# Patient Record
Sex: Female | Born: 1960 | Race: Black or African American | Hispanic: No | Marital: Single | Smoking: Former smoker
Health system: Southern US, Community
[De-identification: ages and names within clinical notes are randomized; demographics above are authoritative.]

## PROBLEM LIST (undated history)

## (undated) DIAGNOSIS — Z6841 Body Mass Index (BMI) 40.0 and over, adult: Secondary | ICD-10-CM

## (undated) DIAGNOSIS — E119 Type 2 diabetes mellitus without complications: Secondary | ICD-10-CM

## (undated) DIAGNOSIS — I10 Essential (primary) hypertension: Secondary | ICD-10-CM

## (undated) DIAGNOSIS — J441 Chronic obstructive pulmonary disease with (acute) exacerbation: Secondary | ICD-10-CM

## (undated) DIAGNOSIS — E7800 Pure hypercholesterolemia, unspecified: Secondary | ICD-10-CM

## (undated) DIAGNOSIS — J45909 Unspecified asthma, uncomplicated: Secondary | ICD-10-CM

## (undated) DIAGNOSIS — I5033 Acute on chronic diastolic (congestive) heart failure: Secondary | ICD-10-CM

## (undated) HISTORY — DX: Acute on chronic diastolic (congestive) heart failure: I50.33

## (undated) HISTORY — PX: OTHER SURGICAL HISTORY: SHX169

## (undated) HISTORY — DX: Body Mass Index (BMI) 40.0 and over, adult: Z684

## (undated) HISTORY — DX: Chronic obstructive pulmonary disease with (acute) exacerbation: J44.1

---

## 2014-06-03 ENCOUNTER — Emergency Department (HOSPITAL_COMMUNITY)
Admission: EM | Admit: 2014-06-03 | Discharge: 2014-06-03 | Disposition: A | Discharge status: Home / Self Care | Payer: Medicaid Other | Attending: Emergency Medicine | Admitting: Emergency Medicine

## 2014-06-03 ENCOUNTER — Emergency Department (HOSPITAL_COMMUNITY): Payer: Medicaid Other

## 2014-06-03 DIAGNOSIS — Z3202 Encounter for pregnancy test, result negative: Secondary | ICD-10-CM | POA: Diagnosis not present

## 2014-06-03 DIAGNOSIS — T192XXA Foreign body in vulva and vagina, initial encounter: Secondary | ICD-10-CM | POA: Insufficient documentation

## 2014-06-03 DIAGNOSIS — Y92410 Unspecified street and highway as the place of occurrence of the external cause: Secondary | ICD-10-CM | POA: Insufficient documentation

## 2014-06-03 DIAGNOSIS — IMO0002 Reserved for concepts with insufficient information to code with codable children: Secondary | ICD-10-CM | POA: Insufficient documentation

## 2014-06-03 DIAGNOSIS — Y9389 Activity, other specified: Secondary | ICD-10-CM | POA: Insufficient documentation

## 2014-06-03 DIAGNOSIS — S335XXA Sprain of ligaments of lumbar spine, initial encounter: Secondary | ICD-10-CM | POA: Diagnosis not present

## 2014-06-03 DIAGNOSIS — Z79899 Other long term (current) drug therapy: Secondary | ICD-10-CM | POA: Diagnosis not present

## 2014-06-03 DIAGNOSIS — S39012A Strain of muscle, fascia and tendon of lower back, initial encounter: Secondary | ICD-10-CM

## 2014-06-03 DIAGNOSIS — E119 Type 2 diabetes mellitus without complications: Secondary | ICD-10-CM | POA: Insufficient documentation

## 2014-06-03 LAB — URINALYSIS, ROUTINE W REFLEX MICROSCOPIC
BILIRUBIN URINE: NEGATIVE
Glucose, UA: NEGATIVE mg/dL
Ketones, ur: NEGATIVE mg/dL
Leukocytes, UA: NEGATIVE
Nitrite: NEGATIVE
PH: 5.5 (ref 5.0–8.0)
Protein, ur: NEGATIVE mg/dL
SPECIFIC GRAVITY, URINE: 1.018 (ref 1.005–1.030)
Urobilinogen, UA: 1 mg/dL (ref 0.0–1.0)

## 2014-06-03 LAB — URINE MICROSCOPIC-ADD ON

## 2014-06-03 LAB — POC URINE PREG, ED: Preg Test, Ur: NEGATIVE

## 2014-06-03 LAB — WET PREP, GENITAL
Clue Cells Wet Prep HPF POC: NONE SEEN
TRICH WET PREP: NONE SEEN
WBC, Wet Prep HPF POC: NONE SEEN
YEAST WET PREP: NONE SEEN

## 2014-06-03 MED ORDER — DIAZEPAM 5 MG PO TABS: 5.0000 mg | ORAL_TABLET | Freq: Four times a day (QID) | ORAL | Status: DC | PRN

## 2014-06-03 MED ORDER — OXYCODONE-ACETAMINOPHEN 5-325 MG PO TABS
1.0000 | ORAL_TABLET | Freq: Once | ORAL | Status: AC
  Administered 2014-06-03: 1 via ORAL
  Filled 2014-06-03: qty 1
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
## 2014-06-03 NOTE — Discharge Instructions (Signed)
Vaginal Foreign Body A vaginal foreign body is any object that gets stuck or left inside the vagina. Vaginal foreign bodies left in the vagina for a long time can cause irritation and infection. In most cases, symptoms go away once the vaginal foreign body is found and removed. Rarely, a foreign object can break through the walls of the vagina and cause a serious infection inside the abdomen.  CAUSES  The most common vaginal foreign bodies are:  Tampons.  Contraceptive devices.  Toilet tissue left in the vagina.  Small objects that were placed in the vagina out of curiosity and got stuck.  A result of sexual abuse. SIGNS AND SYMPTOMS  Light vaginal bleeding.  Blood-tinged vaginal fluid (discharge).  Vaginal discharge that smells bad.  Vaginal itching or burning.  Redness, swelling, or rash near the opening of the vagina.  Abdominal pain.  Fever.  Burning or frequent urination. DIAGNOSIS  Your health care provider may be able to diagnose a vaginal foreign body based on the information you provide, your symptoms, and a physical exam. Your health care provider may also perform the following tests to check for infection:  A swab of the discharge to check under a microscope for bacteria (culture).  A urine culture.  An examination of the vagina with a small, lighted scope (vaginoscopy).  Imaging tests to get a picture of the inside of your vagina, such as:  Ultrasound.  X-ray.  MRI. TREATMENT  In most cases, a vaginal foreign body can be easily removed and the symptoms usually go away very quickly. Other treatment may include:   If the vaginal foreign body is not easily removed, medicine may be given to make you go to sleep (general anesthesia) to have the object removed.  Emergency surgery may be necessary if an infection spreads through the walls of the vagina into the abdomen (acute abdomen). This is rare.  You may need to take antibiotic medicine if you have a  vaginal or urinary tract infection. HOME CARE INSTRUCTIONS   Take medicines only as directed by your health care provider.  If you were prescribed an antibiotic medicine, finish it all even if you start to feel better.  Do not have sex or use tampons until your health care provider says it is okay.  Do not douche or use vaginal rinses unless your health care provider recommends it.  Keep all follow-up visits as directed by your health care provider. This is important. SEEK MEDICAL CARE IF:  You have abdominal pain or burning pain when urinating.  You have a fever. SEEK IMMEDIATE MEDICAL CARE IF:  You have heavy vaginal bleeding or discharge.   You have very bad abdominal pain.  MAKE SURE YOU:  Understand these instructions.  Will watch your condition.  Will get help right away if you are not doing well or get worse. Document Released: 01/09/2014 Document Reviewed: 06/24/2013 [facility] Patient Information 2015 [place], [place]. This information is not intended to replace advice given to you by your health care provider. Make sure you discuss any questions you have with your health care provider. Back Pain, Adult Low back pain is very common. About 1 in 5 people have back pain.The cause of low back pain is rarely dangerous. The pain often gets better over time.About half of people with a sudden onset of back pain feel better in just 2 weeks. About 8 in 10 people feel better by 6 weeks.  CAUSES Some common causes of back pain include:  Strain of the muscles or ligaments supporting the spine.  Wear and tear (degeneration) of the spinal discs.  Arthritis.  Direct injury to the back. DIAGNOSIS Most of the time, the direct cause of low back pain is not known.However, back pain can be treated effectively even when the exact cause of the pain is unknown.Answering your caregiver's questions about your overall health and symptoms is one of the most accurate ways to make sure the  cause of your pain is not dangerous. If your caregiver needs more information, he or she may order lab work or imaging tests (X-rays or MRIs).However, even if imaging tests show changes in your back, this usually does not require surgery. HOME CARE INSTRUCTIONS For many people, back pain returns.Since low back pain is rarely dangerous, it is often a condition that people can learn to [facility] their own.   Remain active. It is stressful on the back to sit or stand in one place. Do not sit, drive, or stand in one place for more than 30 minutes at a time. Take short walks on level surfaces as soon as pain allows.Try to increase the length of time you walk each day.  Do not stay in bed.Resting more than 1 or 2 days can delay your recovery.  Do not avoid exercise or work.Your body is made to move.It is not dangerous to be active, even though your back may hurt.Your back will likely heal faster if you return to being active before your pain is gone.  Pay attention to your body when you bend and lift. Many people have less discomfortwhen lifting if they bend their knees, keep the load close to their bodies,and avoid twisting. Often, the most comfortable positions are those that put less stress on your recovering back.  Find a comfortable position to sleep. Use a firm mattress and lie on your side with your knees slightly bent. If you lie on your back, put a pillow under your knees.  Only take over-the-counter or prescription medicines as directed by your caregiver. Over-the-counter medicines to reduce pain and inflammation are often the most helpful.Your caregiver may prescribe muscle relaxant drugs.These medicines help dull your pain so you can more quickly return to your normal activities and healthy exercise.  Put ice on the injured area.  Put ice in a plastic bag.  Place a towel between your skin and the bag.  Leave the ice on for 15-20 minutes, 03-04 times a day for the first 2 to 3  days. After that, ice and heat may be alternated to reduce pain and spasms.  Ask your caregiver about trying back exercises and gentle massage. This may be of some benefit.  Avoid feeling anxious or stressed.Stress increases muscle tension and can worsen back pain.It is important to recognize when you are anxious or stressed and learn ways to manage it.Exercise is a great option. SEEK MEDICAL CARE IF:  You have pain that is not relieved with rest or medicine.  You have pain that does not improve in 1 week.  You have new symptoms.  You are generally not feeling well. SEEK IMMEDIATE MEDICAL CARE IF:   You have pain that radiates from your back into your legs.  You develop new bowel or bladder control problems.  You have unusual weakness or numbness in your arms or legs.  You develop nausea or vomiting.  You develop abdominal pain.  You feel faint. Document Released: 08/25/2005 Document Revised: 02/24/2012 Document Reviewed: 12/27/2013 [employer] Patient Information 2015 [place]. This information is not intended to replace advice given to you by your health care provider. Make sure you discuss any questions you have with your health care provider.
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
## 2014-06-05 ENCOUNTER — Telehealth (HOSPITAL_BASED_OUTPATIENT_CLINIC_OR_DEPARTMENT_OTHER): Payer: Self-pay | Admitting: Emergency Medicine

## 2014-06-05 LAB — GC/CHLAMYDIA PROBE AMP
CT Probe RNA: NEGATIVE
GC Probe RNA: NEGATIVE

## 2015-06-25 ENCOUNTER — Emergency Department (HOSPITAL_COMMUNITY)
Admission: EM | Admit: 2015-06-25 | Discharge: 2015-06-25 | Disposition: A | Discharge status: Home / Self Care | Payer: Medicaid Other | Attending: Emergency Medicine | Admitting: Emergency Medicine

## 2015-06-25 ENCOUNTER — Encounter (HOSPITAL_COMMUNITY): Payer: Self-pay | Admitting: *Deleted

## 2015-06-25 ENCOUNTER — Emergency Department (HOSPITAL_COMMUNITY): Payer: Medicaid Other

## 2015-06-25 DIAGNOSIS — Z720 Tobacco use: Secondary | ICD-10-CM | POA: Insufficient documentation

## 2015-06-25 DIAGNOSIS — I10 Essential (primary) hypertension: Secondary | ICD-10-CM | POA: Insufficient documentation

## 2015-06-25 DIAGNOSIS — E119 Type 2 diabetes mellitus without complications: Secondary | ICD-10-CM | POA: Diagnosis not present

## 2015-06-25 DIAGNOSIS — J189 Pneumonia, unspecified organism: Secondary | ICD-10-CM

## 2015-06-25 DIAGNOSIS — J159 Unspecified bacterial pneumonia: Secondary | ICD-10-CM | POA: Diagnosis not present

## 2015-06-25 DIAGNOSIS — Z79899 Other long term (current) drug therapy: Secondary | ICD-10-CM | POA: Diagnosis not present

## 2015-06-25 DIAGNOSIS — J45909 Unspecified asthma, uncomplicated: Secondary | ICD-10-CM | POA: Insufficient documentation

## 2015-06-25 DIAGNOSIS — R05 Cough: Secondary | ICD-10-CM | POA: Diagnosis present

## 2015-06-25 HISTORY — DX: Type 2 diabetes mellitus without complications: E11.9

## 2015-06-25 LAB — CBG MONITORING, ED: Glucose-Capillary: 106 mg/dL — ABNORMAL HIGH (ref 65–99)

## 2015-06-25 MED ORDER — LEVOFLOXACIN 500 MG PO TABS
500.0000 mg | ORAL_TABLET | Freq: Once | ORAL | Status: AC
  Administered 2015-06-25: 500 mg via ORAL
  Filled 2015-06-25: qty 1

## 2015-06-25 MED ORDER — LEVOFLOXACIN 500 MG PO TABS: 500.0000 mg | ORAL_TABLET | Freq: Every day | ORAL | Status: DC

## 2015-06-25 MED ORDER — GUAIFENESIN 100 MG/5ML PO LIQD: 100.0000 mg | ORAL | Status: DC | PRN

## 2015-06-25 NOTE — Discharge Instructions (Signed)

## 2015-06-25 NOTE — ED Notes (Addendum)
Pt complains of cough, congestion fever for the past 5 weeks, which became worse in the past 3 days. Pt has hx of asthma. Pt states her breathing is worse at night.

## 2015-06-25 NOTE — ED Provider Notes (Signed)
CSN: [id number]     Arrival date & time 06/25/15  1304 History  By signing my name below, I, Gwenyth Oberatherine Macek, attest that this documentation has been prepared under the direction and in the presence of Marlon Peliffany Takako Minckler, PA-C.  Electronically Signed: Gwenyth Oberatherine Macek, ED Scribe. 06/25/2015. 2:15 PM.   Chief Complaint  Patient presents with  . Cough  . Nasal Congestion   The history is provided by the patient. No language interpreter was used.    HPI Comments: Sandra Solis is a 54 y.o. female with a history of DM, asthma, high cholesterol and HTN who presents to the Emergency Department complaining of intermittent, moderate productive cough with yellow sputum that started 6 weeks ago. She states sneezing, rhinorrhea and yellow nasal discharge as associated symptoms. Her symptoms becomes worse at night. Pt has tried her inhaler and tea with lemon and honey with no relief. Pt reports that onset of symptoms started after she spent time with her grandchildren who had similar symptoms. Pt has not been monitoring her blood sugar, but is compliant with her medications. She denies fever.   ROS: The patient denies diaphoresis, fever, headache, weakness (general or focal), confusion, change of vision,  dysphagia, aphagia, shortness of breath,  abdominal pains, nausea, vomiting, diarrhea, lower extremity swelling, rash, neck pain, chest pain   PCP: No PCP Per Patient  Blood pressure 118/61, pulse 93, temperature 98 F (36.7 C), temperature source Oral, resp. rate 18, SpO2 94 %.  Past Medical History  Diagnosis Date  . Diabetes mellitus without complication (HCC)    History reviewed. No pertinent past surgical history. No family history on file. Social History  Substance Use Topics  . Smoking status: Current Every Day Smoker  . Smokeless tobacco: None  . Alcohol Use: No   OB History    No data available     Review of Systems  Constitutional: Negative for fever.  HENT: Positive for rhinorrhea  and sneezing.   Respiratory: Positive for cough.    Allergies  Review of patient's allergies indicates no known allergies.  Home Medications   Prior to Admission medications   Medication Sig Start Date End Date Taking? Authorizing Provider  albuterol (PROVENTIL HFA;VENTOLIN HFA) 108 (90 BASE) MCG/ACT inhaler Inhale 2 puffs into the lungs every 6 (six) hours as needed for wheezing or shortness of breath.    Historical Provider, MD  diazepam (VALIUM) 5 MG tablet Take 1 tablet (5 mg total) by mouth every 6 (six) hours as needed for muscle spasms. 06/03/14   Mirian MoMatthew Gentry, MD  guaiFENesin (ROBITUSSIN) 100 MG/5ML liquid Take 5-10 mLs (100-200 mg total) by mouth every 4 (four) hours as needed for cough. 06/25/15   Mozes Sagar Neva SeatGreene, PA-C  levofloxacin (LEVAQUIN) 500 MG tablet Take 1 tablet (500 mg total) by mouth daily. 06/26/15   Tacia Hindley Neva SeatGreene, PA-C  lisinopril (PRINIVIL,ZESTRIL) 10 MG tablet Take 10 mg by mouth every morning.    Historical Provider, MD  metFORMIN (GLUCOPHAGE) 500 MG tablet Take 500 mg by mouth 2 (two) times daily with a meal.    Historical Provider, MD  QUEtiapine (SEROQUEL) 50 MG tablet Take 50 mg by mouth at bedtime as needed (for sleep).    Historical Provider, MD  sertraline (ZOLOFT) 100 MG tablet Take 50 mg by mouth at bedtime.    Historical Provider, MD   BP 118/61 mmHg  Pulse 93  Temp(Src) 98 F (36.7 C) (Oral)  Resp 18  SpO2 94% Physical Exam  Constitutional: She appears well-developed and  well-nourished. No distress.  HENT:  Head: Normocephalic and atraumatic.  Eyes: Conjunctivae and EOM are normal.  Neck: Neck supple. No tracheal deviation present.  Cardiovascular: Normal rate.   Pulmonary/Chest: Effort normal. No accessory muscle usage. No respiratory distress. She has no decreased breath sounds. She has no wheezes. She has rhonchi. She has no rales.  Musculoskeletal:  No lower extremity swelling  Skin: Skin is warm and dry.  Psychiatric: She has a normal  mood and affect. Her behavior is normal.  Nursing note and vitals reviewed.   ED Course  Procedures  DIAGNOSTIC STUDIES: Oxygen Saturation is 94% on RA, adequate by my interpretation.    COORDINATION OF CARE: 2:11 PM Discussed x-ray results and treatment plan with pt which includes Levaquin and Guaifenesin . She agreed to plan.  Labs Review Labs Reviewed  CBG MONITORING, ED    Imaging Review Dg Chest 2 View  06/25/2015  CLINICAL DATA:  Cough and congestion for 5 weeks worsening of symptoms over past 3 days, diabetes mellitus, asthma, smoker EXAM: CHEST  2 VIEW COMPARISON:  None FINDINGS: Borderline enlargement of cardiac silhouette. Mediastinal contours and pulmonary vascularity normal. Atherosclerotic calcification aorta. Minimal central peribronchial thickening. Subsegmental atelectasis LEFT base. No acute infiltrate, pleural effusion or pneumothorax. Scattered endplate spur formation thoracic spine. IMPRESSION: Minimal bronchitic changes with subsegmental atelectasis LEFT base. Electronically Signed   By: Ulyses Southward M.D.   On: 06/25/2015 13:59   I have personally reviewed and evaluated these images as part of my medical decision-making.   EKG Interpretation None      MDM   Final diagnoses:  CAP (community acquired pneumonia)    Patient in no distress. Xray concerning for pneumonia. Due to co morbidities will treat with Levaquin. CBG check in ED and less than 250.  F/u with PCP strict return to ED precautions given. Normal O2 sats on room air, no exertional angina or exertional  SOB.  Medications  levofloxacin (LEVAQUIN) tablet 500 mg (not administered)    54 y.o.Sandra Solis's medical screening exam was performed and I feel the patient has had an appropriate workup for their chief complaint at this time and likelihood of emergent condition existing is low. They have been counseled on decision, discharge, follow up and which symptoms necessitate immediate return to the  emergency department. They or their family verbally stated understanding and agreement with plan and discharged in stable condition.   Vital signs are stable at discharge. Filed Vitals:   06/25/15 1318  BP:   Pulse:   Temp: 98 F (36.7 C)  Resp:    I personally performed the services described in this documentation, which was scribed in my presence. The recorded information has been reviewed and is accurate.    Marlon Pel, PA-C 06/25/15 1425  Laurence Spates, MD 06/26/15 [phone]

## 2015-11-19 ENCOUNTER — Emergency Department (HOSPITAL_COMMUNITY)
Admission: EM | Admit: 2015-11-19 | Discharge: 2015-11-19 | Disposition: A | Discharge status: Home / Self Care | Payer: Medicaid Other | Attending: Emergency Medicine | Admitting: Emergency Medicine

## 2015-11-19 ENCOUNTER — Emergency Department (HOSPITAL_COMMUNITY): Payer: Medicaid Other

## 2015-11-19 ENCOUNTER — Encounter (HOSPITAL_COMMUNITY): Payer: Self-pay | Admitting: Family Medicine

## 2015-11-19 DIAGNOSIS — F17200 Nicotine dependence, unspecified, uncomplicated: Secondary | ICD-10-CM | POA: Diagnosis not present

## 2015-11-19 DIAGNOSIS — E119 Type 2 diabetes mellitus without complications: Secondary | ICD-10-CM | POA: Diagnosis not present

## 2015-11-19 DIAGNOSIS — R05 Cough: Secondary | ICD-10-CM | POA: Insufficient documentation

## 2015-11-19 DIAGNOSIS — R079 Chest pain, unspecified: Secondary | ICD-10-CM | POA: Insufficient documentation

## 2015-11-19 DIAGNOSIS — J45909 Unspecified asthma, uncomplicated: Secondary | ICD-10-CM | POA: Insufficient documentation

## 2015-11-19 DIAGNOSIS — I10 Essential (primary) hypertension: Secondary | ICD-10-CM | POA: Diagnosis not present

## 2015-11-19 HISTORY — DX: Pure hypercholesterolemia, unspecified: E78.00

## 2015-11-19 HISTORY — DX: Essential (primary) hypertension: I10

## 2015-11-19 HISTORY — DX: Unspecified asthma, uncomplicated: J45.909

## 2015-11-19 LAB — I-STAT TROPONIN, ED: TROPONIN I, POC: 0 ng/mL (ref 0.00–0.08)

## 2015-11-19 LAB — CBC
HCT: 46.1 % — ABNORMAL HIGH (ref 36.0–46.0)
Hemoglobin: 14.2 g/dL (ref 12.0–15.0)
MCH: 26.5 pg (ref 26.0–34.0)
MCHC: 30.8 g/dL (ref 30.0–36.0)
MCV: 86.2 fL (ref 78.0–100.0)
Platelets: 289 10*3/uL (ref 150–400)
RBC: 5.35 MIL/uL — AB (ref 3.87–5.11)
RDW: 15.3 % (ref 11.5–15.5)
WBC: 9.6 10*3/uL (ref 4.0–10.5)

## 2015-11-19 LAB — BASIC METABOLIC PANEL
ANION GAP: 12 (ref 5–15)
BUN: 7 mg/dL (ref 6–20)
CALCIUM: 9.7 mg/dL (ref 8.9–10.3)
CO2: 30 mmol/L (ref 22–32)
Chloride: 100 mmol/L — ABNORMAL LOW (ref 101–111)
Creatinine, Ser: 0.76 mg/dL (ref 0.44–1.00)
GFR calc non Af Amer: >60 mL/min (ref >60)
Glucose, Bld: 192 mg/dL — ABNORMAL HIGH (ref 65–99)
Potassium: 4.1 mmol/L (ref 3.5–5.1)
SODIUM: 142 mmol/L (ref 135–145)

## 2015-11-19 NOTE — ED Notes (Signed)
Unable to locate when called for room x3 
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
## 2016-01-18 ENCOUNTER — Ambulatory Visit: Payer: Medicaid Other | Admitting: Gastroenterology

## 2016-05-22 ENCOUNTER — Ambulatory Visit (HOSPITAL_COMMUNITY)
Admission: RE | Admit: 2016-05-22 | Discharge: 2016-05-22 | Disposition: A | Discharge status: Home / Self Care | Payer: Medicaid Other | Source: Ambulatory Visit | Attending: Primary Care | Admitting: Primary Care

## 2016-05-22 ENCOUNTER — Other Ambulatory Visit (HOSPITAL_COMMUNITY): Payer: Self-pay | Admitting: Primary Care

## 2016-05-22 DIAGNOSIS — J449 Chronic obstructive pulmonary disease, unspecified: Secondary | ICD-10-CM | POA: Diagnosis present

## 2016-05-22 DIAGNOSIS — R52 Pain, unspecified: Secondary | ICD-10-CM

## 2016-05-22 DIAGNOSIS — R079 Chest pain, unspecified: Secondary | ICD-10-CM | POA: Diagnosis not present

## 2017-12-30 ENCOUNTER — Encounter (HOSPITAL_COMMUNITY): Payer: Self-pay | Admitting: Emergency Medicine

## 2017-12-30 ENCOUNTER — Other Ambulatory Visit: Payer: Self-pay

## 2017-12-30 ENCOUNTER — Emergency Department (HOSPITAL_COMMUNITY): Payer: Medicaid Other

## 2017-12-30 ENCOUNTER — Inpatient Hospital Stay (HOSPITAL_COMMUNITY)
Admission: EM | Admit: 2017-12-30 | Discharge: 2018-01-02 | DRG: 291 | Disposition: A | Discharge status: Home / Self Care | Payer: Medicaid Other | Attending: Internal Medicine | Admitting: Internal Medicine

## 2017-12-30 DIAGNOSIS — Z833 Family history of diabetes mellitus: Secondary | ICD-10-CM | POA: Diagnosis not present

## 2017-12-30 DIAGNOSIS — F329 Major depressive disorder, single episode, unspecified: Secondary | ICD-10-CM | POA: Diagnosis present

## 2017-12-30 DIAGNOSIS — Z8249 Family history of ischemic heart disease and other diseases of the circulatory system: Secondary | ICD-10-CM

## 2017-12-30 DIAGNOSIS — Y92230 Patient room in hospital as the place of occurrence of the external cause: Secondary | ICD-10-CM | POA: Diagnosis not present

## 2017-12-30 DIAGNOSIS — Z794 Long term (current) use of insulin: Secondary | ICD-10-CM | POA: Diagnosis not present

## 2017-12-30 DIAGNOSIS — Z7984 Long term (current) use of oral hypoglycemic drugs: Secondary | ICD-10-CM | POA: Diagnosis not present

## 2017-12-30 DIAGNOSIS — J45909 Unspecified asthma, uncomplicated: Secondary | ICD-10-CM

## 2017-12-30 DIAGNOSIS — I10 Essential (primary) hypertension: Secondary | ICD-10-CM | POA: Diagnosis not present

## 2017-12-30 DIAGNOSIS — Z9981 Dependence on supplemental oxygen: Secondary | ICD-10-CM | POA: Diagnosis not present

## 2017-12-30 DIAGNOSIS — E1165 Type 2 diabetes mellitus with hyperglycemia: Secondary | ICD-10-CM | POA: Diagnosis not present

## 2017-12-30 DIAGNOSIS — Z79899 Other long term (current) drug therapy: Secondary | ICD-10-CM | POA: Diagnosis not present

## 2017-12-30 DIAGNOSIS — I5021 Acute systolic (congestive) heart failure: Secondary | ICD-10-CM | POA: Diagnosis present

## 2017-12-30 DIAGNOSIS — Z66 Do not resuscitate: Secondary | ICD-10-CM | POA: Diagnosis present

## 2017-12-30 DIAGNOSIS — F17210 Nicotine dependence, cigarettes, uncomplicated: Secondary | ICD-10-CM | POA: Diagnosis present

## 2017-12-30 DIAGNOSIS — I5032 Chronic diastolic (congestive) heart failure: Secondary | ICD-10-CM | POA: Diagnosis present

## 2017-12-30 DIAGNOSIS — M7989 Other specified soft tissue disorders: Secondary | ICD-10-CM | POA: Diagnosis not present

## 2017-12-30 DIAGNOSIS — R0602 Shortness of breath: Secondary | ICD-10-CM

## 2017-12-30 DIAGNOSIS — E785 Hyperlipidemia, unspecified: Secondary | ICD-10-CM | POA: Diagnosis present

## 2017-12-30 DIAGNOSIS — Z87891 Personal history of nicotine dependence: Secondary | ICD-10-CM

## 2017-12-30 DIAGNOSIS — E119 Type 2 diabetes mellitus without complications: Secondary | ICD-10-CM | POA: Diagnosis not present

## 2017-12-30 DIAGNOSIS — I110 Hypertensive heart disease with heart failure: Principal | ICD-10-CM | POA: Diagnosis present

## 2017-12-30 DIAGNOSIS — T380X5A Adverse effect of glucocorticoids and synthetic analogues, initial encounter: Secondary | ICD-10-CM | POA: Diagnosis not present

## 2017-12-30 DIAGNOSIS — R0600 Dyspnea, unspecified: Secondary | ICD-10-CM

## 2017-12-30 DIAGNOSIS — G4700 Insomnia, unspecified: Secondary | ICD-10-CM | POA: Diagnosis present

## 2017-12-30 DIAGNOSIS — F339 Major depressive disorder, recurrent, unspecified: Secondary | ICD-10-CM | POA: Diagnosis not present

## 2017-12-30 DIAGNOSIS — I5033 Acute on chronic diastolic (congestive) heart failure: Secondary | ICD-10-CM

## 2017-12-30 DIAGNOSIS — R0902 Hypoxemia: Secondary | ICD-10-CM | POA: Diagnosis present

## 2017-12-30 DIAGNOSIS — F17200 Nicotine dependence, unspecified, uncomplicated: Secondary | ICD-10-CM | POA: Diagnosis not present

## 2017-12-30 DIAGNOSIS — J441 Chronic obstructive pulmonary disease with (acute) exacerbation: Secondary | ICD-10-CM | POA: Diagnosis present

## 2017-12-30 HISTORY — DX: Acute on chronic diastolic (congestive) heart failure: I50.33

## 2017-12-30 LAB — CBC WITH DIFFERENTIAL/PLATELET
BASOS ABS: 0 10*3/uL (ref 0.0–0.1)
Basophils Relative: 1 %
EOS ABS: 0 10*3/uL (ref 0.0–0.7)
Eosinophils Relative: 0 %
HCT: 46.1 % — ABNORMAL HIGH (ref 36.0–46.0)
HEMOGLOBIN: 14.5 g/dL (ref 12.0–15.0)
LYMPHS ABS: 1.6 10*3/uL (ref 0.7–4.0)
LYMPHS PCT: 25 %
MCH: 28 pg (ref 26.0–34.0)
MCHC: 31.5 g/dL (ref 30.0–36.0)
MCV: 89.2 fL (ref 78.0–100.0)
Monocytes Absolute: 0.7 10*3/uL (ref 0.1–1.0)
Monocytes Relative: 11 %
NEUTROS PCT: 63 %
Neutro Abs: 4 10*3/uL (ref 1.7–7.7)
Platelets: 230 10*3/uL (ref 150–400)
RBC: 5.17 MIL/uL — AB (ref 3.87–5.11)
RDW: 14.5 % (ref 11.5–15.5)
WBC: 6.2 10*3/uL (ref 4.0–10.5)

## 2017-12-30 LAB — BASIC METABOLIC PANEL
ANION GAP: 12 (ref 5–15)
BUN: 6 mg/dL (ref 6–20)
CHLORIDE: 96 mmol/L — AB (ref 101–111)
CO2: 27 mmol/L (ref 22–32)
Calcium: 9.2 mg/dL (ref 8.9–10.3)
Creatinine, Ser: 0.72 mg/dL (ref 0.44–1.00)
GFR calc Af Amer: >60 mL/min (ref >60)
Glucose, Bld: 155 mg/dL — ABNORMAL HIGH (ref 65–99)
POTASSIUM: 3.9 mmol/L (ref 3.5–5.1)
SODIUM: 135 mmol/L (ref 135–145)

## 2017-12-30 LAB — CBG MONITORING, ED
GLUCOSE-CAPILLARY: 268 mg/dL — AB (ref 65–99)
GLUCOSE-CAPILLARY: 250 mg/dL — AB (ref 65–99)

## 2017-12-30 LAB — TROPONIN I
TROPONIN I: 0.12 ng/mL — AB (ref <0.03)
Troponin I: 0.1 ng/mL (ref <0.03)

## 2017-12-30 LAB — BRAIN NATRIURETIC PEPTIDE: B NATRIURETIC PEPTIDE 5: 158.3 pg/mL — AB (ref 0.0–100.0)

## 2017-12-30 MED ORDER — SERTRALINE HCL 50 MG PO TABS
50.0000 mg | ORAL_TABLET | Freq: Every day | ORAL | Status: DC
  Administered 2017-12-31: 50 mg via ORAL
  Filled 2017-12-30 (×2): qty 1

## 2017-12-30 MED ORDER — METHYLPREDNISOLONE SODIUM SUCC 125 MG IJ SOLR
125.0000 mg | Freq: Once | INTRAMUSCULAR | Status: AC
  Administered 2017-12-30: 125 mg via INTRAVENOUS
  Filled 2017-12-30: qty 2

## 2017-12-30 MED ORDER — ACETAMINOPHEN 500 MG PO TABS
1000.0000 mg | ORAL_TABLET | Freq: Four times a day (QID) | ORAL | Status: DC | PRN
  Administered 2017-12-30: 1000 mg via ORAL
  Filled 2017-12-30: qty 2

## 2017-12-30 MED ORDER — QUETIAPINE FUMARATE 25 MG PO TABS
50.0000 mg | ORAL_TABLET | Freq: Every evening | ORAL | Status: DC | PRN
  Filled 2017-12-30: qty 1

## 2017-12-30 MED ORDER — AZITHROMYCIN 250 MG PO TABS
500.0000 mg | ORAL_TABLET | Freq: Once | ORAL | Status: AC
Start: 2017-12-30 — End: 2017-12-30
  Administered 2017-12-30: 500 mg via ORAL
  Filled 2017-12-30: qty 2

## 2017-12-30 MED ORDER — LABETALOL HCL 5 MG/ML IV SOLN
5.0000 mg | INTRAVENOUS | Status: DC | PRN
  Administered 2017-12-30 – 2018-01-02 (×2): 5 mg via INTRAVENOUS
  Filled 2017-12-30 (×3): qty 4

## 2017-12-30 MED ORDER — AZITHROMYCIN 250 MG PO TABS
250.0000 mg | ORAL_TABLET | Freq: Every day | ORAL | Status: DC
  Administered 2017-12-31 – 2018-01-02 (×3): 250 mg via ORAL
  Filled 2017-12-30 (×3): qty 1

## 2017-12-30 MED ORDER — INSULIN ASPART 100 UNIT/ML ~~LOC~~ SOLN
0.0000 [IU] | Freq: Every day | SUBCUTANEOUS | Status: DC
  Administered 2017-12-30: 2 [IU] via SUBCUTANEOUS
  Filled 2017-12-30: qty 1

## 2017-12-30 MED ORDER — FUROSEMIDE 10 MG/ML IJ SOLN
40.0000 mg | Freq: Once | INTRAMUSCULAR | Status: AC
  Administered 2017-12-30: 40 mg via INTRAVENOUS
  Filled 2017-12-30: qty 4

## 2017-12-30 MED ORDER — LISINOPRIL 10 MG PO TABS
10.0000 mg | ORAL_TABLET | Freq: Every morning | ORAL | Status: DC
  Administered 2017-12-31 – 2018-01-02 (×3): 10 mg via ORAL
  Filled 2017-12-30 (×3): qty 1

## 2017-12-30 MED ORDER — IPRATROPIUM BROMIDE 0.02 % IN SOLN
0.5000 mg | Freq: Once | RESPIRATORY_TRACT | Status: AC
  Administered 2017-12-30: 0.5 mg via RESPIRATORY_TRACT
  Filled 2017-12-30: qty 2.5

## 2017-12-30 MED ORDER — ALBUTEROL SULFATE (2.5 MG/3ML) 0.083% IN NEBU
2.5000 mg | INHALATION_SOLUTION | RESPIRATORY_TRACT | Status: DC | PRN
  Administered 2018-01-01: 2.5 mg via RESPIRATORY_TRACT
  Filled 2017-12-30: qty 3

## 2017-12-30 MED ORDER — ALBUTEROL SULFATE (2.5 MG/3ML) 0.083% IN NEBU
5.0000 mg | INHALATION_SOLUTION | Freq: Once | RESPIRATORY_TRACT | Status: AC
  Administered 2017-12-30: 5 mg via RESPIRATORY_TRACT
  Filled 2017-12-30: qty 6

## 2017-12-30 MED ORDER — NITROGLYCERIN 2 % TD OINT
1.0000 [in_us] | TOPICAL_OINTMENT | Freq: Once | TRANSDERMAL | Status: AC
  Administered 2017-12-30: 1 [in_us] via TOPICAL
  Filled 2017-12-30: qty 1

## 2017-12-30 MED ORDER — ENOXAPARIN SODIUM 40 MG/0.4ML ~~LOC~~ SOLN
40.0000 mg | SUBCUTANEOUS | Status: DC
  Administered 2017-12-30 – 2018-01-02 (×4): 40 mg via SUBCUTANEOUS
  Filled 2017-12-30 (×4): qty 0.4

## 2017-12-30 MED ORDER — ASPIRIN 81 MG PO CHEW
324.0000 mg | CHEWABLE_TABLET | Freq: Once | ORAL | Status: AC
  Administered 2017-12-30: 324 mg via ORAL
  Filled 2017-12-30: qty 4

## 2017-12-30 MED ORDER — SODIUM CHLORIDE 0.9 % IV SOLN
INTRAVENOUS | Status: DC
  Administered 2017-12-31: 06:00:00 via INTRAVENOUS
  Administered 2017-12-31: 10 mL via INTRAVENOUS

## 2017-12-30 MED ORDER — PREDNISONE 20 MG PO TABS
40.0000 mg | ORAL_TABLET | Freq: Every day | ORAL | Status: DC
  Administered 2017-12-31 – 2018-01-02 (×3): 40 mg via ORAL
  Filled 2017-12-30 (×3): qty 2

## 2017-12-30 MED ORDER — INSULIN ASPART 100 UNIT/ML ~~LOC~~ SOLN
0.0000 [IU] | Freq: Three times a day (TID) | SUBCUTANEOUS | Status: DC
  Administered 2017-12-31: 1 [IU] via SUBCUTANEOUS
  Administered 2017-12-31: 7 [IU] via SUBCUTANEOUS
  Administered 2017-12-31: 3 [IU] via SUBCUTANEOUS
  Administered 2018-01-01: 5 [IU] via SUBCUTANEOUS
  Administered 2018-01-01: 2 [IU] via SUBCUTANEOUS
  Administered 2018-01-01 – 2018-01-02 (×2): 1 [IU] via SUBCUTANEOUS
  Administered 2018-01-02: 9 [IU] via SUBCUTANEOUS
  Filled 2017-12-30: qty 1

## 2017-12-30 NOTE — ED Notes (Signed)
No exertional sob  Since 1500

## 2017-12-30 NOTE — ED Notes (Signed)
Patient transported to X-ray 

## 2017-12-30 NOTE — ED Notes (Signed)
Pt c/o a headache she wants her bp med andher diabetic medicine and food

## 2017-12-30 NOTE — ED Notes (Signed)
Admitting [facility] w/ Internal Medicine [facility] @ [phone].

## 2017-12-30 NOTE — ED Notes (Signed)
Unable to  Talk to the pt she has been on her cell

## 2017-12-30 NOTE — H&P (Signed)
Date: 12/30/2017               Patient Name:  Sandra Solis MRN: [id number]  DOB: November 03, 1960 Age / Sex: 57 y.o., female   PCP: [facility]         Medical Service: Internal Medicine Teaching Service         Attending Physician: Dr. Sandre Kittyaines, Elwin MochaAlexander N, MD    First Contact: Dr. Crista ElliotHarbrecht Pager: [phone]  Second Contact: Dr. Obie DredgeBlum Pager: [phone]       After Hours (After 5p/  First Contact Pager: [phone]  weekends / holidays): Second Contact Pager: [phone]   Chief Complaint: shortness of breath  History of Present Illness: Sandra Solis is a 57 year old female with PMHx of asthma, DM, HTN, and HLD presenting with a 5 day history of shortness of breath. Last week she reports having a URI after being in contact with her sick grandson. She was coughing up a greenish-yellow sputum. Since then, she reports feeling short of breath both at rest and on exertion. Her cough has continued, with a more yellow-colored sputum. She reports having an expired albuterol inhaler at home and she has run out of her nebulizer treatments, so she hasn't been able to use these, but she thinks they would have helped her feel better. Her symptoms are worse than those she feels with her asthma. She endorses orthopnea and PND. She also endorses lower extremity swelling, particularly in her R ankle because she's been sleeping with her R leg hanging off the bed and L leg elevated. She has a 20 pack-year smoking history. She denies dizziness, chest pain, and palpitations. She denies history of MI or heart failure. She does not remember if she's previously had an echo. She says she has been told she has a "touch" of COPD in the past.  She went to see her PCP two days ago on 4/22, where she was told that her blood pressure was elevated. Before this visit, she had run out of her medications. She was not able to afford the co-pay on her medications, so she is still without her medications at  home.  Meds: Patient reports taking metformin, lisinopril, an albuterol inhaler. She cannot remember any of her other medications. Current Meds  Medication Sig  . albuterol (PROVENTIL HFA;VENTOLIN HFA) 108 (90 BASE) MCG/ACT inhaler Inhale 2 puffs into the lungs every 6 (six) hours as needed for wheezing or shortness of breath.  . lisinopril (PRINIVIL,ZESTRIL) 10 MG tablet Take 10 mg by mouth every morning.  . metFORMIN (GLUCOPHAGE) 500 MG tablet Take 500 mg by mouth 2 (two) times daily with a meal.  . QUEtiapine (SEROQUEL) 50 MG tablet Take 50 mg by mouth at bedtime as needed (for sleep).  . sertraline (ZOLOFT) 100 MG tablet Take 50 mg by mouth at bedtime.    Allergies: Allergies as of 12/30/2017  . (No Known Allergies)   Past Medical History:  Diagnosis Date  . Asthma   . Diabetes mellitus without complication (HCC)   . High cholesterol   . Hypertension     Family History: Mother and father have both passed. Mother had history of cancer, unknown type. Sister had diagnosed breast cancer. No known family history of MI or DM.  Social History: Lives by herself. Has 4 children and 18 grandchildren. 20 pack-year smoking history, reports quitting 4 days ago after being tired of feeling short of breath all the time. Reports an interest in  quitting for good. Reports drinking occasionally. Occasional marijuana use. No other drug use.  Review of Systems: Constitutional: No fevers/chills or diaphoresis. HEENT: + headache. No changes in vision. Pulm: + wheezing. No hemoptysis or pain on inspiration. GI: No abdominal pain or changes in bowel frequency or color.  Physical Exam: Blood pressure (!) 199/95, pulse 93, temperature 99 F (37.2 C), temperature source Oral, resp. rate 12, SpO2 94 %. General: Tearful, sitting on side of bed. NAD. Cardiac: RRR. No murmurs, rubs, gallops.  Pulm: Faint bibasilar crackles. No wheezing. Breathing comfortably on 2L [place]. Abd: +BS, soft, NT/ND. Ext: Trace  edema to the knee bilaterally, R > L.  EKG: personally reviewed my interpretation is NSR. T wave inversions in inferolateral leads, seen on prior EKG 11/19/2015.  CXR: personally reviewed my interpretation is increased pulmonary edema, blunting of the L costophrenic angle, enlarged cardiac silhouette.  Significant Labs: Glucose 155 BNP 158.3 Troponin 0.12  Assessment & Plan by Problem: Active Problems:   Shortness of breath   Hypertension  Sandra Solis is a 57 year old female with PMHx of asthma, DM, HTN, HLD, and cigarette smoking (20 pack-years) who presents with signs of an acute heart failure exacerbation and possible COPD exacerbation.  Acute heart failure exacerbation: No known history of heart failure. Risk factors include HTN, DM, HLD, smoking, and obesity. Patient is endorsing dyspnea at rest and on exertion, orthopnea, and dyspnea over 5 days. BNP elevated to 158. CXR showing signs of pulmonary edema and cardiomegaly. Troponin elevated to 0.12, though no reports of chest pain or diaphoresis and no new signs of ischemia on EKG, making ACS less likely. - Echo - Lasix IV 40mg , redose in AM - Strict I&Os and daily weights  ?COPD: Patient has a long history of smoking and diagnosed asthma and possible COPD. She is not on any medications for COPD at home. She had a URI prior to onset of symptoms, which may have triggered an exacerbation of underlying lung disease. Patient reports improvement of symptoms following Atrovent nebulizer treatment in the ED. - Azithromycin 500mg  PO once followed by 250mg  PO for 5 day course - Prednisone 40mg  daily - Albuterol nebs 2.5mg  q4h prn  HTN: Hypertensive to 170s-190s/80s-90s. Reports not being out of her lisinopril and not being able to afford refills after her recent PCP visit. Symptomatic with headache, though this has also occurred following nitro patch placement in ED. No acute changes in vision. - Home lisinopril 10mg  daily  DM: CBG 155 on  admission, increased to 268 following methylprednisolone in the ED. On metoprolol 500mg  BID at home. - SSI-S - Hold home metoprolol  Diet: Carb modified DVT ppx: Lovenox  Code: DNR  Dispo: Admit patient to Inpatient with expected length of stay greater than 2 midnights.  Signed: Carie Caddy, Medical Student 12/30/2017, 5:19 PM

## 2017-12-30 NOTE — ED Notes (Signed)
Patient CBG was 268, Nurse Thayer OhmChris was informed.

## 2017-12-30 NOTE — ED Triage Notes (Signed)
Patient from home with [facility] for shortness of breath x1 week, productive cough with green and yellow sputum, denies pain. Also reports being out of lisinopril due to cost for one week. Patient alert and oriented at this time and in no apparent distress. 20g saline lock in left hand. Non pitting edema in bilateral lower extremities.

## 2017-12-30 NOTE — ED Notes (Signed)
No orders for food yet

## 2017-12-30 NOTE — ED Notes (Signed)
Pt still asking for food no diet entered  Admitting doctor paged

## 2017-12-30 NOTE — ED Notes (Signed)
Pt. Urine was 200cc
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
## 2017-12-30 NOTE — H&P (Signed)
Date: 12/30/2017               Patient Name:  Sandra Solis MRN: [id number]  DOB: [birth year] Age / Sex: 57 y.o., female   PCP: [facility]         Medical Service: Internal Medicine Teaching Service         Attending Physician: Dr. Sandre Kitty, Elwin Mocha, MD    First Contact: Dr. Crista Elliot Pager: [phone]  Second Contact: Dr. Obie Dredge Pager: [phone]       After Hours (After 5p/  First Contact Pager: [phone]  weekends / holidays): Second Contact Pager: [phone]   Chief Complaint: Shortness of breath  History of Present Illness: Sandra Solis is a 57 yo F with a PMHx notable for asthma, insulin independent diabetes, HLD, HTN, who presented to the ED for a five day history of dyspnea and right lower extremity swelling. She stated that she began to experience dyspnea that was worse while lying flat prompting her to utilize several pillows to sleep. In addition she endorsed symptoms of a productive cough with yellow/green sputum, headache, high blood pressure, and worsening of her symptoms with pollen. She denied diarrhea, chills, nausea, vomiting, fever, chills, myalgias, chest pain, abdominal pain, visual changes or other concerning symptoms. She stated that she has utilized her inhaler several times per day over the past several days with little improvement in her symptoms. She is unable to ambulate greater than across a small room with out dyspnea when she presented.   ED Course: In the ED BNP was elevated to 158.3, BMP unremarkable, CBC without leukocytosis or anemia, troponin 0.12. CXR was concerning for possible bronchitic vs interstitial changes/infintrate with EKG unchanged from prior. Her dyspnea responded well to ipratropium and albuterol nebs, solumedrol and oxygen.   Meds:  Current Meds  Medication Sig  . albuterol (PROVENTIL HFA;VENTOLIN HFA) 108 (90 BASE) MCG/ACT inhaler Inhale 2 puffs into the lungs every 6 (six) hours as needed for wheezing or shortness of  breath.  . lisinopril (PRINIVIL,ZESTRIL) 10 MG tablet Take 10 mg by mouth every morning.  . metFORMIN (GLUCOPHAGE) 500 MG tablet Take 500 mg by mouth 2 (two) times daily with a meal.  . QUEtiapine (SEROQUEL) 50 MG tablet Take 50 mg by mouth at bedtime as needed (for sleep).  . sertraline (ZOLOFT) 100 MG tablet Take 50 mg by mouth at bedtime.   Allergies: Allergies as of 12/30/2017  . (No Known Allergies)   Past Medical History:  Diagnosis Date  . Asthma   . Diabetes mellitus without complication (HCC)   . High cholesterol   . Hypertension    Family History:  Mother-DMII, HTN Father-Stroke Sister-Stroke  Social History:  Smoked 1-2 ppd for 20 years to an approximate 30 year smoking history.  Last EtOH drink on February 13th Last Marijuana use on February 13th  Review of Systems: A complete ROS was negative except as per HPI.   Physical Exam: Blood pressure (!) 199/95, pulse 93, temperature 99 F (37.2 C), temperature source Oral, resp. rate 12, SpO2 94 %. Physical Exam  Constitutional: She is oriented to person, place, and time. She appears well-developed and well-nourished. No distress.  HENT:  Head: Normocephalic and atraumatic.  Eyes: Conjunctivae and EOM are normal.  Neck: Normal range of motion. Neck supple. No hepatojugular reflux and no JVD present.  Cardiovascular: Normal rate and regular rhythm.  No murmur heard. Pulmonary/Chest: Effort normal. No stridor. No respiratory distress. She has wheezes  in the right middle field, the right lower field, the left middle field and the left lower field. She has rhonchi in the right lower field and the left lower field.  Abdominal: Soft. Bowel sounds are normal. She exhibits no distension. There is no tenderness.  Musculoskeletal: She exhibits no tenderness.       Right lower leg: She exhibits edema. She exhibits no tenderness.       Left lower leg: She exhibits edema. She exhibits no tenderness.  Neurological: She is alert  and oriented to person, place, and time.  Skin: Skin is warm. Capillary refill takes less than 2 seconds. She is not diaphoretic.  Psychiatric: She has a normal mood and affect.    EKG: personally reviewed my interpretation is NSR with T-wave inversion in leads I, II, III, aVF and V4-V6 which were present previously.   CXR: personally reviewed my interpretation is that there is possibly increased interstitial edema vs infiltrate  Assessment & Plan by Problem: Active Problems:   Shortness of breath   Hypertension  Assessment:  Sheila OatsGrace Rathke is a 57 yo F with a PMHx notable for asthma, insulin independent diabetes, HLD, HTN, who presented to the ED for a five day history of dyspnea and right lower extremity swelling. Although her presentation is concerning for new onset congestive heart failure I suspect there is a degree of COPD given her smoking history, productive cough, history of asthma, improvement with solumedrol and albuterol. We will admit her for evaluation of CHF and treat her underlying pulmonary issues. I anticipate that we will need an echocardiogram to evaluate her cardiac function given her dyspnea, peripheral edema, with elevated BNP (which can be falsely lower than expected given the patients BMI) although absent JVD.    Plan: Dyspnea: Most likely COPD exacerbation although she does not have a history of PFT's on file. BNP elevated to 158.3. --Continue albuterol nebs --Prednisone 40mg  Daily --Azithromycin 500mg  today followed by 250mg  x 4 days --Ipratropium x 1 dose  --Echocardiogram-ordered --BMP in am --Repeat CXR due to poor quality study obtained during this admission  --Continue troponin trend  Hypertension: Patient has not had the funds to cover her copay for her medications as an outpatient. She will be started on her previously initiated ACEi. Will plan to decrease her BP by 20% today. --Continue Lisinopril 40mg  daily --Labetalol 5mg  Q2 hours PRN for hypertension  Sys >180, Diastolic >110 hold for HR <70  Diabetes: Blood glucose elevated to >260, most likely secondary to her IV steroids. Holding home metformin for possible contrast procedure if indicated.   Start SSI sensitive and HS coverage  Depression: No acute issues. --Continued sertraline 40mg  daily with breakfast  Insomnia: Continued quetiapine 50 mg QHS  Diet: Carb modified Code: DNR requested and reviewed with patient extensively  Fluids: N/A VTE PPX: Enoxaparin  Dispo: Admit patient to Inpatient with expected length of stay greater than 2 midnights.  Signed: Lanelle BalHarbrecht, Coye Dawood, MD 12/30/2017, 5:43 PM  Pager: Pager# [phone]

## 2017-12-30 NOTE — ED Notes (Signed)
Carb Modified diet dinner tray ordered.  

## 2017-12-30 NOTE — ED Notes (Signed)
Care handoff to Redcresthris C. Rn

## 2017-12-30 NOTE — ED Notes (Signed)
Pt told she now has a diet order  Meal ordered

## 2017-12-30 NOTE — ED Notes (Signed)
Up to thr br  Asking for good

## 2017-12-30 NOTE — ED Notes (Signed)
Admitting team at bedside.

## 2017-12-31 ENCOUNTER — Encounter (HOSPITAL_COMMUNITY): Payer: Self-pay

## 2017-12-31 ENCOUNTER — Inpatient Hospital Stay (HOSPITAL_COMMUNITY): Payer: Medicaid Other

## 2017-12-31 DIAGNOSIS — R0600 Dyspnea, unspecified: Secondary | ICD-10-CM

## 2017-12-31 LAB — CBG MONITORING, ED: Glucose-Capillary: 141 mg/dL — ABNORMAL HIGH (ref 65–99)

## 2017-12-31 LAB — BASIC METABOLIC PANEL
Anion gap: 9 (ref 5–15)
BUN: 9 mg/dL (ref 6–20)
CO2: 33 mmol/L — ABNORMAL HIGH (ref 22–32)
Calcium: 9.3 mg/dL (ref 8.9–10.3)
Chloride: 95 mmol/L — ABNORMAL LOW (ref 101–111)
Creatinine, Ser: 0.71 mg/dL (ref 0.44–1.00)
GFR calc Af Amer: >60 mL/min (ref >60)
GFR calc non Af Amer: >60 mL/min (ref >60)
Glucose, Bld: 154 mg/dL — ABNORMAL HIGH (ref 65–99)
Potassium: 3.8 mmol/L (ref 3.5–5.1)
Sodium: 137 mmol/L (ref 135–145)

## 2017-12-31 LAB — ECHOCARDIOGRAM COMPLETE
Height: 63 in
Weight: 3952 oz

## 2017-12-31 LAB — GLUCOSE, CAPILLARY
Glucose-Capillary: 231 mg/dL — ABNORMAL HIGH (ref 65–99)
Glucose-Capillary: 325 mg/dL — ABNORMAL HIGH (ref 65–99)
Glucose-Capillary: 107 mg/dL — ABNORMAL HIGH (ref 65–99)

## 2017-12-31 LAB — HIV ANTIBODY (ROUTINE TESTING W REFLEX): HIV Screen 4th Generation wRfx: NONREACTIVE

## 2017-12-31 MED ORDER — METFORMIN HCL 500 MG PO TABS
500.0000 mg | ORAL_TABLET | Freq: Two times a day (BID) | ORAL | Status: DC
  Administered 2017-12-31 – 2018-01-02 (×5): 500 mg via ORAL
  Filled 2017-12-31 (×5): qty 1

## 2017-12-31 NOTE — ED Notes (Signed)
Pt coughing up clear phlegm; refusing to wear O2 as advised by this RN, O2 sats dropping into upper 70s. Pt states "I can't wear it if I got this stuff coming out my nose" and pulled the cannula off.

## 2017-12-31 NOTE — Plan of Care (Signed)

## 2017-12-31 NOTE — Progress Notes (Signed)
   Subjective: Sandra Solis was sitting up on the edge of her bed today upon entering the room. Although she feels much improved today, she continues to require oxygen therapy for comfort and to maintain her O2 sats > 88%.   Objective:  Vital signs in last 24 hours: Vitals:   12/31/17 0045 12/31/17 0100 12/31/17 0115 12/31/17 0549  BP: (!) 145/73 (!) 142/77 (!) 164/76 126/62  Pulse: 79 77 78 79  Resp: 15 15 14 15   Temp:      TempSrc:      SpO2: 96% 96% 95% 91%  Weight:      Height:       Physical Exam:  General: in no acute distress, alert and oriented, conversant, afebrile, nondiaphoretic Cardio: RRR, no murmurs rubs or gallops, no JVD Pulmonary: Bilateral wheezing and crackles in the upper and lower lung fields GI: soft, normal bowel sounds Musc: trace edema bilaterally   Assessment/Plan:  Active Problems:   Shortness of breath   Hypertension  Assessment:  Sandra Solis is a 57 yo F with a PMHx notable for asthma, insulin independent diabetes, HLD, HTN, who presented to the ED for a five day history of dyspnea and right lower extremity swelling. Although her presentation is concerning for new onset congestive heart failure I suspect there is a degree of COPD given her smoking history, productive cough, history of asthma, improvement with solumedrol and albuterol. We will admit her for evaluation of CHF and treat her underlying pulmonary issues. I anticipate that we will need an echocardiogram to evaluate her cardiac function given her dyspnea, peripheral edema, with elevated BNP (which can be falsely lower than expected given the patients BMI) although absent JVD.    Plan: Dyspnea: Most likely COPD exacerbation although she does not have a history of PFT's on file. BNP elevated to 158.3 and as such we have ordered an echocardiogram to assist with the determination. Given her continue reliance on oxygen, we will keep her overnight and evaluate her for improvement in the morning as  a potential discharge.  --Continue albuterol nebs --Prednisone 40mg  Daily --Azithromycin 500mg  today followed by 250mg  x 4 days --Ipratropium x 1 dose  --Echocardiogram-in process --BMP in am --Repeat CXR due to poor quality was again minimally contributory to her diagnosis  --Continue troponin trend flat at 0.12 and 0.10. No indication to repeat  Hypertension: Patient has not had the funds to cover her copay for her medications as an outpatient. She will be started on her previously initiated ACEi. Will plan to decrease her BP by 20% today. --Continue Lisinopril 40mg  daily --Labetalol 5mg  Q2 hours PRN for hypertension Sys >180, Diastolic >110 hold for HR <70  Diabetes: Blood glucose elevated to >325, most likely secondary to her IV steroids.    Start SSI sensitive and HS coverage Continue home metformin   Depression: No acute issues. --Continued sertraline 40mg  daily with breakfast  Insomnia: Continued quetiapine 50 mg QHS  Diet: Carb modified Code: DNR requested and reviewed with patient extensively  Fluids: N/A VTE PPX: Enoxaparin  Dispo: Anticipated discharge in approximately 1 day(s).   Lanelle BalHarbrecht, Ashaun Gaughan, MD 12/31/2017, 6:37 AM Pager: Pager# [phone]

## 2017-12-31 NOTE — Progress Notes (Signed)
  Echocardiogram 2D Echocardiogram has been performed.  Sandra Solis 12/31/2017, 5:15 PM

## 2017-12-31 NOTE — ED Notes (Signed)
Patient ambulatory to bathroom with steady gait at this time 

## 2017-12-31 NOTE — ED Notes (Signed)
Patient CBG was 141. 

## 2018-01-01 LAB — GLUCOSE, CAPILLARY
Glucose-Capillary: 139 mg/dL — ABNORMAL HIGH (ref 65–99)
Glucose-Capillary: 178 mg/dL — ABNORMAL HIGH (ref 65–99)
Glucose-Capillary: 267 mg/dL — ABNORMAL HIGH (ref 65–99)
Glucose-Capillary: 97 mg/dL (ref 65–99)

## 2018-01-01 LAB — BASIC METABOLIC PANEL
Anion gap: 13 (ref 5–15)
BUN: 11 mg/dL (ref 6–20)
CO2: 31 mmol/L (ref 22–32)
Calcium: 9.2 mg/dL (ref 8.9–10.3)
Chloride: 92 mmol/L — ABNORMAL LOW (ref 101–111)
Creatinine, Ser: 0.69 mg/dL (ref 0.44–1.00)
GFR calc Af Amer: >60 mL/min (ref >60)
GFR calc non Af Amer: >60 mL/min (ref >60)
Glucose, Bld: 127 mg/dL — ABNORMAL HIGH (ref 65–99)
Potassium: 3.9 mmol/L (ref 3.5–5.1)
Sodium: 136 mmol/L (ref 135–145)

## 2018-01-01 MED ORDER — IPRATROPIUM-ALBUTEROL 0.5-2.5 (3) MG/3ML IN SOLN
3.0000 mL | Freq: Three times a day (TID) | RESPIRATORY_TRACT | Status: DC
  Administered 2018-01-02 (×2): 3 mL via RESPIRATORY_TRACT
  Filled 2018-01-01 (×2): qty 3

## 2018-01-01 MED ORDER — AMLODIPINE BESYLATE 5 MG PO TABS
5.0000 mg | ORAL_TABLET | Freq: Every day | ORAL | Status: DC
  Administered 2018-01-01 – 2018-01-02 (×2): 5 mg via ORAL
  Filled 2018-01-01: qty 1

## 2018-01-01 MED ORDER — FUROSEMIDE 10 MG/ML IJ SOLN
40.0000 mg | Freq: Once | INTRAMUSCULAR | Status: AC
  Administered 2018-01-01: 40 mg via INTRAVENOUS
  Filled 2018-01-01: qty 4

## 2018-01-01 MED ORDER — LORATADINE 10 MG PO TABS
10.0000 mg | ORAL_TABLET | Freq: Every day | ORAL | Status: DC
  Administered 2018-01-01 – 2018-01-02 (×2): 10 mg via ORAL
  Filled 2018-01-01 (×2): qty 1

## 2018-01-01 MED ORDER — SALINE SPRAY 0.65 % NA SOLN
1.0000 | NASAL | Status: DC | PRN
Start: 2018-01-01 — End: 2018-01-02
  Filled 2018-01-01: qty 44

## 2018-01-01 MED ORDER — IPRATROPIUM-ALBUTEROL 0.5-2.5 (3) MG/3ML IN SOLN
3.0000 mL | Freq: Four times a day (QID) | RESPIRATORY_TRACT | Status: DC
  Administered 2018-01-01 (×2): 3 mL via RESPIRATORY_TRACT
  Filled 2018-01-01 (×2): qty 3

## 2018-01-01 NOTE — Progress Notes (Signed)
SATURATION QUALIFICATIONS: (This note is used to comply with regulatory documentation for home oxygen)  Patient Saturations on Room Air at Rest = 88%  Patient Saturations on Room Air while Ambulating = 84%  Patient Saturations on 2 Liters of oxygen while Ambulating = 91%  Please briefly explain why patient needs home oxygen: To maintain oxygen saturation > 90% at rest and while ambulating.  Laurina Bustlearoline Asa Baudoin, PT, DPT Acute Rehabilitation Services  Pager: [phone]

## 2018-01-01 NOTE — Progress Notes (Addendum)
Physical Therapy Treatment Patient Details Name: Sandra Solis MRN: [id number] DOB: 1961/04/18 Today's Date: 01/01/2018    History of Present Illness Pt. is a 57 y.o. F with significant PMH of asthma, DM, HTN, HLD, presenting with 5 day history of shortness of breath. Last week she reports having a URI after being in contact with her sick granson.     PT Comments    Patient presenting with decreased functional mobility secondary to cardiopulmonary issues and decreased endurance. Ambulates 200 feet modified independently with no assistive device and decreased gait speed. Oxygen saturation decrease from 92% to 86% on 2L O2 with ambulation. Instructions on activity pacing with standing rest breaks and pursed lip breathing. Patient requesting home health services since she lives alone for assistance with ADL's/IADL's. No further acute care PT needs at this time; patient has been mobilizing with RN/NT and is at a modified independent level.  All education has been completed and the patient has no further questions.  See below for any follow-up Physical Therapy or equipment needs. PT is signing off. Thank you for this referral.    Follow Up Recommendations  Home health PT     Equipment Recommendations  Cane;3in1 (PT)((patient request)); portable oxygen   Recommendations for Other Services       Precautions / Restrictions Precautions Precautions: Fall Restrictions Weight Bearing Restrictions: No    Mobility  Bed Mobility Overal bed mobility: Independent                Transfers Overall transfer level: Modified independent                  Ambulation/Gait Ambulation/Gait assistance: Modified independent (Device/Increase time) Ambulation Distance (Feet): 200 Feet Assistive device: None Gait Pattern/deviations: Step-through pattern;Decreased stride length;Wide base of support Gait velocity: decreased   General Gait Details: Patient modified independent with ambulation  with slow gait speed. One standing rest break required due to decreased oxygen saturation. Instructed on pursed lip breathing. Mild dynamic balance deviations noted   Stairs             Wheelchair Mobility    Modified Rankin (Stroke Patients Only)       Balance Overall balance assessment: Mild deficits observed, not formally tested                                          Cognition Arousal/Alertness: Awake/alert Behavior During Therapy: WFL for tasks assessed/performed Overall Cognitive Status: Within Functional Limits for tasks assessed                                        Exercises      General Comments        Pertinent Vitals/Pain Pain Assessment: No/denies pain    Home Living Family/patient expects to be discharged to:: Private residence Living Arrangements: Alone Available Help at Discharge: Friend(s) Type of Home: House Home Access: Stairs to enter Entrance Stairs-Rails: Can reach both Home Layout: One level Home Equipment: Grab bars - tub/shower      Prior Function Level of Independence: Independent          PT Goals (current goals can now be found in the care plan section) Acute Rehab PT Goals Patient Stated Goal: Wants [facility]  PT Goal Formulation: With patient  Time For Goal Achievement: 01/15/18 Potential to Achieve Goals: Good    Frequency           PT Plan      Co-evaluation              AM-PAC PT "6 Clicks" Daily Activity  Outcome Measure  Difficulty turning over in bed (including adjusting bedclothes, sheets and blankets)?: None Difficulty moving from lying on back to sitting on the side of the bed? : None Difficulty sitting down on and standing up from a chair with arms (e.g., wheelchair, bedside commode, etc,.)?: None Help needed moving to and from a bed to chair (including a wheelchair)?: None Help needed walking in hospital room?: None Help needed climbing 3-5  steps with a railing? : A Little 6 Click Score: 23    End of Session Equipment Utilized During Treatment: Oxygen Activity Tolerance: Patient tolerated treatment well Patient left: Other (comment);with call bell/phone within reach(Seated EOB) Nurse Communication: Mobility status PT Visit Diagnosis: Unsteadiness on feet (R26.81);Difficulty in walking, not elsewhere classified (R26.2)     Time: 4098-1191 PT Time Calculation (min) (ACUTE ONLY): 22 min  Charges:                       G Codes:      Sandra Solis, PT, DPT Acute Rehabilitation Services  Pager: [phone]    Sandra Solis 01/01/2018, 3:28 PM

## 2018-01-01 NOTE — Care Management Note (Signed)
Case Management Note Donn PieriniKristi Marieclaire Bettenhausen RN, BSN Unit 4E-Case Manager [phone]  Patient Details  Name: Sandra OatsGrace Gwaltney MRN: [id number] Date of Birth: 1961-03-11  Subjective/Objective:  Pt admitted with COPD/SOB                  Action/Plan: PTA pt lived at home alone, per PT eval recommendation for HHPT- pt will need orders placed prior to discharge- spoke with pt at bedside- per conversation pt is followed at the [facility]- she states that she would like a BSC, shower chair and cane for discharge- MD please place DME orders for these- pt may also need home 02-  Pt speaks about wanting "small tanks" discussed how home 02 is set up, per pt she also [facility]- application submitted on Monday 4/22- it takes at least 7 days for expedited process- can call and check on status after 7 days. Per pt she wants [facility]- have explained that she may not get approved for many visits with Medicaid. CM will await orders and f/u for transition of care needs.   Expected Discharge Date:  01/03/18               Expected Discharge Plan:  Home w Home Health Services  In-House Referral:  NA  Discharge planning Services  CM Consult  Post Acute Care Choice:  Durable Medical Equipment, Home Health Choice offered to:  Patient  DME Arranged:    DME Agency:  [facility]  HH [facility]    [facility]  Status of Service:  In process, will continue to follow  If discussed at Long Length of Stay Meetings, dates discussed:    Discharge Disposition:   Additional Comments:  Darrold SpanWebster, Erabella Kuipers Hall, RN 01/01/2018, 4:29 PM

## 2018-01-01 NOTE — Discharge Summary (Addendum)
Name: Sandra Solis MRN: [id number] DOB: 06-26-1961 56 y.o. PCP: [facility]  Date of Admission: 12/30/2017  8:46 AM Date of Discharge: 01/02/2018 Attending Physician: Dr. Rogelia Boga  Discharge Diagnosis: Active Problems:   Acute on chronic diastolic heart failure (HCC)   Hypertension   COPD exacerbation (HCC)   Discharge Medications: Allergies as of 01/02/2018   No Known Allergies     Medication List    STOP taking these medications   diazepam 5 MG tablet Commonly known as:  VALIUM   levofloxacin 500 MG tablet Commonly known as:  LEVAQUIN     TAKE these medications   albuterol 108 (90 Base) MCG/ACT inhaler Commonly known as:  PROVENTIL HFA;VENTOLIN HFA Inhale 2 puffs into the lungs every 6 (six) hours as needed for wheezing or shortness of breath.   amLODipine 5 MG tablet Commonly known as:  NORVASC Take 1 tablet (5 mg total) by mouth daily.   guaiFENesin 100 MG/5ML liquid Commonly known as:  ROBITUSSIN Take 5-10 mLs (100-200 mg total) by mouth every 4 (four) hours as needed for cough.   ipratropium-albuterol 0.5-2.5 (3) MG/3ML Soln Commonly known as:  DUONEB Take 3 mLs by nebulization 3 (three) times daily.   lisinopril 20 MG tablet Commonly known as:  PRINIVIL,ZESTRIL Take 0.5 tablets (10 mg total) by mouth every morning. What changed:  medication strength   loratadine 10 MG tablet Commonly known as:  CLARITIN Take 1 tablet (10 mg total) by mouth daily.   metFORMIN 500 MG tablet Commonly known as:  GLUCOPHAGE Take 500 mg by mouth 2 (two) times daily with a meal.   predniSONE 20 MG tablet Commonly known as:  DELTASONE Take 2 tablets (40 mg total) by mouth daily with breakfast.   QUEtiapine 50 MG tablet Commonly known as:  SEROQUEL Take 50 mg by mouth at bedtime as needed (for sleep).   sertraline 100 MG tablet Commonly known as:  ZOLOFT Take 50 mg by mouth at bedtime.   sodium chloride 0.65 % Soln nasal spray Commonly  known as:  OCEAN Place 1 spray into both nostrils as needed for congestion.   tiotropium 18 MCG inhalation capsule Commonly known as:  SPIRIVA HANDIHALER Place 1 capsule (18 mcg total) into inhaler and inhale daily.            Durable Medical Equipment  (From admission, onward)        Start     Ordered   01/02/18 0000  DME Nebulizer machine    Question:  Patient needs a nebulizer to treat with the following condition  Answer:  COPD (chronic obstructive pulmonary disease) (HCC)   01/02/18 1331      Disposition and follow-up:   Ms.Sandra Solis was discharged from [facility] in Stable condition.  At the hospital follow up visit please address:  1.  New oxygen requirement at home-patient experienced oxygen desaturation at rest and with ambulation <88% while symptomatically feeling she was at baseline. We have no PFT's of recent on file. She will need PFT's and continued assessment for the persistent need of O2 at home.       Due to deconditioning and her increased O2 requirement she requested home health which was approved. Please reassess for the continued need for home health.      A nebulizer was provided for duoneb use, she may need refills if considered appropriate.       HTN: was not taking her prescribed antihypertensive. Started on  CCB in addition to ACEi. Please assess for HTN and the continued need to titrate or discontinue her antihypertensives.   2.  Labs / imaging needed at time of follow-up: PFT's, BMP due to ACEi initiation   3.  Pending labs/ test needing follow-up: n/a  Follow-up Appointments: Follow-up Information    [facility]. Schedule an appointment as soon as possible for a visit in 3 day(s).   Contact information: [address] [place] [id number] [phone]          Hospital Course by problem list: Active Problems:   Acute on chronic diastolic heart failure (HCC)   Hypertension   COPD  exacerbation (HCC)   COPD exacerbation:  Sandra OatsGrace Solis is a 57 yo F with a PMHx notable for asthma, insulin independent diabetes, HLD, HTN, who presented to the ED for a five day history of dyspnea and right lower extremity swelling.Although her presentation was concerning for new onset congestive heart failure we suspected that her presentation was most likely secondary to COPD given her smoking history, productive cough, history of asthma, and improvement with solumedrol and albuterol. She was admitted for evaluation for CHF and treatment of her underlying pulmonary issues. Echocardiogram with G1DD in the current setting is non consistent with florid volume overload and increased oxygen demand with the lack of concomitant findings on physical exam and CXR. She was treated for a COPD exacerbation despite the lack of PFT's to confirm the diagnosis. She will need to follow up with her PCP and most likely complete PFT's once she has improved symptomatically. The patient was walked in the hospital hallway with pulse oximetry and noted to desaturate to <88% while stating that she felt as good or better at that time than compared to her baseline. Given her O2 requirement at rest to maintain >88% SpO2 and with ambulation she was discharged home on oxygen 2L via nasal cannula.   Hypertension: Patient stated that she has not had the funds to cover her copay for her medications as an outpatient. She was started on her previously initiated ACEi and amlodipine to better control her BP.   Discharge Vitals:   BP (!) 172/68 (BP Location: Right Arm)   Pulse 82   Temp 98.1 F (36.7 C) (Oral)   Resp 19   Ht 5\' 3"  (1.6 m)   Wt 247 lb 14.4 oz (112.4 kg)   SpO2 100%   BMI 43.91 kg/m   Pertinent Labs, Studies, and Procedures:  CBC Latest Ref Rng & Units 12/30/2017 11/19/2015  WBC 4.0 - 10.5 K/uL 6.2 9.6  Hemoglobin 12.0 - 15.0 g/dL 46.914.5 62.914.2  Hematocrit 52.836.0 - 46.0 % 46.1(H) 46.1(H)  Platelets 150 - 400 K/uL 230  289   CMP Latest Ref Rng & Units 01/01/2018 12/31/2017 12/30/2017  Glucose 65 - 99 mg/dL 413(K127(H) 440(N154(H) 027(O155(H)  BUN 6 - 20 mg/dL 11 9 6   Creatinine 0.44 - 1.00 mg/dL 5.360.69 6.440.71 0.340.72  Sodium 135 - 145 mmol/L 136 137 135  Potassium 3.5 - 5.1 mmol/L 3.9 3.8 3.9  Chloride 101 - 111 mmol/L 92(L) 95(L) 96(L)  CO2 22 - 32 mmol/L 31 33(H) 27  Calcium 8.9 - 10.3 mg/dL 9.2 9.3 9.2   Echocardiogram 12/31/2017: Study Conclusions - Left ventricle: The cavity size was normal. Wall thickness was   increased in a pattern of mild LVH. Systolic function was normal.   The estimated ejection fraction was in the range of 55% to 60%.  Wall motion was normal; there were no regional wall motion   abnormalities. Doppler parameters are consistent with abnormal   left ventricular relaxation (grade 1 diastolic dysfunction). The   E/e&' ratio is between 8-15, suggesting indeterminate LV filling   pressure. - Mitral valve: Mildly thickened leaflets . There was trivial   regurgitation. - Left atrium: Moderately dilated. - Inferior vena cava: The IVC was dilated >2.1 cm, but collapsed   >50% wtih inspiration, suggesting an elevated RA pressure of 8   mmHg. - Pericardium, extracardiac: A trivial pericardial effusion was   identified. Features were not consistent with tamponade   physiology. Impressions: - LVEF 55-60%, mild LVH, normal wall motion, grade 1 DD,   indeterminate LV filling pressure, trivial MR, moderate LAE,   dilated IVC that collapses, trivial pericardial effusion without   tamponade features.  Discharge Instructions: Discharge Instructions    DME Nebulizer machine   Complete by:  As directed    Patient needs a nebulizer to treat with the following condition:  COPD (chronic obstructive pulmonary disease) (HCC)   Diet - low sodium heart healthy   Complete by:  As directed    Increase activity slowly   Complete by:  As directed       Signed: Lanelle Bal, MD 01/03/2018, 7:21 AM     Pager: Pager# [phone]

## 2018-01-01 NOTE — Progress Notes (Signed)
Pt c/o secretions, coughing with not always productive.  Provided Acapella Valve (Flutter).  Pt able to expectorate large amount white/clear secretions.  Instructed in use on own. Pt able to repeat back instructions accurately,  and gave good return demonstration.  MD ordered Q6H Duoneb rx.  Continue to monitor.

## 2018-01-01 NOTE — Progress Notes (Signed)
Subjective: The patient was resting comfortably in her bed today upon entering the room. She denied pain or dyspnea that is above baseline. However, she noted shortness of breath when she walks at home or performs the majority of her ADL's. She had previously requested home health aid as an outpatient and would like to request additional services while here to which we agreed to consult PT/OT for an evaluation.   Objective:  Vital signs in last 24 hours: Vitals:   12/31/17 1217 12/31/17 1739 12/31/17 1943 01/01/18 0444  BP: (!) 158/75 (!) 180/87 (!) 109/94 (!) 156/96  Pulse: 82  79 70  Resp:   20 16  Temp:   98.1 F (36.7 C) 97.8 F (36.6 C)  TempSrc:   Oral Oral  SpO2: (!) 88%  98% 96%  Weight:    247 lb 14.4 oz (112.4 kg)  Height:       Physical Exam: General: alert and oriented, afebrile, nondiaphoretic, conversant, appears comfortable at rest Cardio: RRR, no murmur rubs or gallops, no JVD Pulm: Trivial wheezing auscultated in the upper lung fields, no rhonchi or rails GI: soft, nontender Musc: non edematous, nontender distal lower extremities   Assessment/Plan:  Active Problems:   Shortness of breath   Hypertension  Assessment:  Sandra Solis is a [age] yo F with a PMHx notable for asthma, insulin independent diabetes, HLD, HTN, who presented to the ED for a five day history of dyspnea and right lower extremity swelling.Although her presentation is concerning for new onset congestive heart failure I suspect there is a degree of COPD given her smoking history, productive cough, history of asthma, improvement with solumedrol and albuterol. We will admit her for evaluation of CHF and treat her underlying pulmonary issues. Echocardiogram with G1DD in the current setting is non consistent with florid volume overload. At this time I feel that we are treating a COPD exacerbation despite the lack of PFT's to confirm the diagnosis. She will need to follow up with her PCP and complete  PFT's once she has improved symptomatically.   Plan: Dyspnea: Most likely a COPD exacerbation although she does not have a history of PFT's on file. She continues to demonstrate notable dyspnea and oxygen desaturation while at rest. She continues to demonstrate notable cough with sputum production concerning for continued COPD exacerbation. She will remain hospitalized with possible discharge home tomorrow pending evaluation.  --Ambulate with pulse ox to determine SpO2 needs with activity  --Continue duonebs --Prednisone 40mg  Daily x 5 days max --Azithromycin 500mg  today followed by 250mg  x 4 days --Echocardiogram-w/ G1DD, trace pericardial effusion, LV EF 55-60% --BMP in am unremarkable --PT/OT evaluations requested due to patients deconditioning   Hypertension: Patient has not had the funds to cover her copay for her medications as an outpatient as per report. She will be started on her previously initiated ACEi.  --Continue Lisinopril 40mg  daily --Start Amlodipine 5 mg daily --Labetalol 5mg  Q2 hours PRN for hypertension Sys >180, Diastolic >110 hold for HR <70  Diabetes: Blood glucose elevated to >325 most likely secondary to her IV steroids. This has decreased markedly to 127, 139 and 178 today.  Continue SSI sensitive and HS coverage Continue home metformin  No further intervention indicated  Depression: No acute issues. --Continued sertraline 40mg  daily with breakfast  Insomnia: Continued quetiapine 50 mg QHS  Diet: Carb modified Code: DNR requested and reviewed with patient extensively  Fluids: N/A VTE PPX: Enoxaparin Dispo: Anticipated discharge in approximately 1-2 day(s).  Lanelle Bal, MD 01/01/2018, 6:46 AM Pager: Pager# [phone]

## 2018-01-01 NOTE — Progress Notes (Signed)
Inpatient Diabetes Program Recommendations  AACE/ADA: New Consensus Statement on Inpatient Glycemic Control (2015)  Target Ranges:  Prepandial:   less than 140 mg/dL      Peak postprandial:   less than 180 mg/dL (1-2 hours)      Critically ill patients:  140 - 180 mg/dL   Results for [name], Sandra (MRN [id number]) as of 01/01/2018 10:15  Ref. Range 12/31/2017 07:49 12/31/2017 12:24 12/31/2017 16:16 12/31/2017 21:06 01/01/2018 06:07  Glucose-Capillary Latest Ref Range: 65 - 99 mg/dL 657141 (H) 846231 (H) 962325 (H) 107 (H) 139 (H)   Review of Glycemic Control  Diabetes history: DM2 Outpatient Diabetes medications: Metformin 500 mg BID Current orders for Inpatient glycemic control: Novolog 0-9 units TID with meals, Novolog 0-5 units QHS, Metformin 500 mg BID; Prednisone 40 QAM  Inpatient Diabetes Program Recommendations: Insulin - Meal Coverage: If steroids continued, please consider ordering Novolog 3 units TID with meals for meal coverage if patient eats at least 50% of meals.  Thanks, Orlando PennerMarie Roxy Mastandrea, RN, MSN, CDE Diabetes Coordinator Inpatient Diabetes Program [phone] (Team Pager from 8am to 5pm)

## 2018-01-02 DIAGNOSIS — I5033 Acute on chronic diastolic (congestive) heart failure: Secondary | ICD-10-CM

## 2018-01-02 DIAGNOSIS — J441 Chronic obstructive pulmonary disease with (acute) exacerbation: Secondary | ICD-10-CM

## 2018-01-02 DIAGNOSIS — Z9981 Dependence on supplemental oxygen: Secondary | ICD-10-CM

## 2018-01-02 DIAGNOSIS — I110 Hypertensive heart disease with heart failure: Principal | ICD-10-CM

## 2018-01-02 DIAGNOSIS — F17200 Nicotine dependence, unspecified, uncomplicated: Secondary | ICD-10-CM

## 2018-01-02 DIAGNOSIS — Z794 Long term (current) use of insulin: Secondary | ICD-10-CM

## 2018-01-02 HISTORY — DX: Chronic obstructive pulmonary disease with (acute) exacerbation: J44.1

## 2018-01-02 LAB — GLUCOSE, CAPILLARY
Glucose-Capillary: 129 mg/dL — ABNORMAL HIGH (ref 65–99)
Glucose-Capillary: 355 mg/dL — ABNORMAL HIGH (ref 65–99)

## 2018-01-02 MED ORDER — LORATADINE 10 MG PO TABS: 10.0000 mg | ORAL_TABLET | Freq: Every day | ORAL | 1 refills | Status: DC

## 2018-01-02 MED ORDER — TIOTROPIUM BROMIDE MONOHYDRATE 18 MCG IN CAPS: 18.0000 ug | ORAL_CAPSULE | Freq: Every day | RESPIRATORY_TRACT | 2 refills | Status: DC

## 2018-01-02 MED ORDER — PREDNISONE 20 MG PO TABS: 40.0000 mg | ORAL_TABLET | Freq: Every day | ORAL | 0 refills | Status: DC

## 2018-01-02 MED ORDER — IPRATROPIUM-ALBUTEROL 0.5-2.5 (3) MG/3ML IN SOLN: 3.0000 mL | Freq: Three times a day (TID) | RESPIRATORY_TRACT | 1 refills | Status: DC

## 2018-01-02 MED ORDER — LISINOPRIL 20 MG PO TABS: 10.0000 mg | ORAL_TABLET | Freq: Every morning | ORAL | 1 refills | Status: DC

## 2018-01-02 MED ORDER — SALINE SPRAY 0.65 % NA SOLN: 1.0000 | NASAL | 0 refills | Status: AC | PRN

## 2018-01-02 MED ORDER — AMLODIPINE BESYLATE 5 MG PO TABS: 5.0000 mg | ORAL_TABLET | Freq: Every day | ORAL | 1 refills | Status: DC

## 2018-01-02 NOTE — Progress Notes (Addendum)
Subjective: Ms. Newland is feeling well today she feels like the difficulty breathing that she had a presentation is significantly improved and she is back at her baseline.  She had a sensation of chest congestion which was especially bad yesterday but began using a flutter valve and now this is completely resolved. Yesterday she required oxygen while walking in the halls to maintain oxygen saturation over 88% he understands that she will need to go home with oxygen.  She feels that she will be able to afford her home medications now that she has decided to quit smoking and can use the money towards those.  She describes good support at home from her fianc and youngest daughter who are very helpful but would like further help from a home health aide and nurse.  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  We discussed that comfort level with cooking and cleaning varies from aid to aid and that it would be unlikely for her to have an aide in her home every day.  She was updated on the results of the echo, all questions were answered, she was encouraged to schedule a hospital follow-up in our clinic first thing when they open Monday and call the clinic number if she has any questions before then.   Objective:  Vital signs in last 24 hours: Vitals:   01/02/18 0444 01/02/18 0852 01/02/18 0914 01/02/18 1202  BP:   (!) 167/66 (!) 188/76  Pulse: 73  87 97  Resp: 19  (!) 28 20  Temp: 98.3 F (36.8 C)  98.1 F (36.7 C)   TempSrc: Oral  Oral   SpO2: 97% 100% 100% 90%  Weight:      Height:       General: Well-appearing, no acute distress Cardiac: Regular rate and rhythm, no murmurs appreciated, normal S1 and S2, trace bilateral lower extremity edema Pulm: Normal work of breathing, speaking in full sentences, lungs clear to auscultation bilateral GI: Soft, nontender,  nondistended  Assessment/Plan:  COPD exacerbation  Acute exacerbation of diastolic heart failure  - No PFT's on file, pt reports "mild COPD" when they were last checked  --Echocardiogram-w/ G1DD, trace pericardial effusion, LV EF 55-60% - chest congestion improving, home with flutter valve - continue Prednisone  Daily x 5 days, 4/24 > 4/28 - continue Azithromycin  today followed by  x 5 day total course 4/24 > 4/28, weight is stable at 247, exam is consistent with improvement in signs of pulmonary edema still with trace peripheral edema. - received Lasix 40 mg yesterday, 1.8 L output yesterday, net 4.2 out this admission,  - required 2 L with ambulation yesterday to maintain spO2 >88%, also desated to 88% on room air at rest  - prescribe 2 L - 24 hours daily for discharge  - will order DME nebulizer for home duoneb use  - encouraged smoking cessation  - PT: HH PT  - Patient request HH nurse, aid, 3:1 cane and shower chair all of which will also be ordered at discharge    Hypertension: -Patient has not had the funds to cover her copay for her medications as an outpatient as per report.  --Continue Lisinopril  daily --Start Amlodipine 5 mg daily - patient knows that she will be able to afford these medications now that she has quit smoking and has those  funds availabe  Diabetes: Blood glucose elevated to >325 most likely secondary to her IV steroids. This has decreased markedly to 127, 139 and 178 today. She did require 1-5 units of novolog throughout this admission.  - Continue home metformin - cont outpatient management   Depression: No acute issues. --Continued sertraline  daily with breakfast  Insomnia: Continued quetiapine 50 mg QHS  Dispo: Anticipated discharge today   Eulah Pont, MD 01/02/2018, 12:45 PM Pager: [phone]
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
## 2018-01-02 NOTE — Discharge Instructions (Signed)
Ms. Odle, you were hospitalized for a worsening of your COPD and diastolic congestive heart failure For your hypertension-please start taking amlodipine 5 mg daily and lisinopril 20 mg daily  For the heart failure-please start weighing yourself daily and taking Lasix 40 mg as needed: When you have gained greater than 3 pounds in 24 hours or 5 pounds in one week. Call your doctors office if you have this pattern of rapid weight gain, difficulty breathing, or worsening of the swelling in your legs.  For the worsening of your COPD, please take one additional dose of the prednisone and azithromycin. Please use the duoneb medication as needed when you develop shortness of breath. Start using this spiriva inhaler daily and ask your doctor to get a new set of pulmonary function test if you haven't had this in the past year.   Please schedule a follow-up with our office, they will be open Monday at 9 AM.  If you have any problems before that appointment can be scheduled you can call the office phone number where a physician will always be available through [phone]  Please call our clinic if you have any questions or concerns, we may be able to help and keep you from a long and expensive emergency room wait. Our clinic and after hours phone number is [phone], there is always someone available.

## 2018-01-02 NOTE — Progress Notes (Signed)
Patient is for discharge home today; awaiting for Attending MD to sign off orders on DME so that home oxygen, nebulizer machine, 3:1 and cane can be delivered to the bedside and also need face to face form completed in epic to order [facility] services per Medicare  Guide lines; B Shelba Flake [phone]

## 2018-01-05 NOTE — Progress Notes (Addendum)
Received call from [facility], they do have availability for the patient to provide any services for her; VM left with patient concerning unable to have [facility]; [facility] called, CM talked to [facility], they will accept the patient for [facility] services; Alexis Goodell [phone]  11:26 am- Received call from [facility] with [facility], patient stated that she changed her mind and did not want any HHC at this time; B Ave Filter RN,MHA,BSN

## 2018-02-14 ENCOUNTER — Other Ambulatory Visit: Payer: Self-pay | Admitting: Internal Medicine

## 2018-02-23 ENCOUNTER — Institutional Professional Consult (permissible substitution): Payer: Medicaid Other | Admitting: Internal Medicine

## 2018-03-16 ENCOUNTER — Telehealth: Payer: Self-pay

## 2018-03-16 NOTE — Telephone Encounter (Signed)
SENT REFERRAL TO SCHEDULING AND FILED NOTES 

## 2018-03-29 ENCOUNTER — Institutional Professional Consult (permissible substitution): Payer: Medicaid Other | Admitting: Internal Medicine

## 2018-04-06 ENCOUNTER — Institutional Professional Consult (permissible substitution): Payer: Medicaid Other | Admitting: Internal Medicine

## 2018-05-03 ENCOUNTER — Institutional Professional Consult (permissible substitution): Payer: Medicaid Other | Admitting: Internal Medicine

## 2018-06-01 ENCOUNTER — Ambulatory Visit: Payer: Medicaid Other | Admitting: Cardiology
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
## 2018-06-02 ENCOUNTER — Institutional Professional Consult (permissible substitution): Payer: Medicaid Other | Admitting: Internal Medicine

## 2018-06-07 ENCOUNTER — Other Ambulatory Visit: Payer: Self-pay | Admitting: Internal Medicine

## 2018-06-07 ENCOUNTER — Ambulatory Visit: Payer: Medicaid Other | Admitting: Cardiology

## 2018-06-10 ENCOUNTER — Institutional Professional Consult (permissible substitution): Payer: Medicaid Other | Admitting: Internal Medicine

## 2018-06-14 ENCOUNTER — Encounter: Payer: Self-pay | Admitting: Cardiology

## 2018-06-14 ENCOUNTER — Ambulatory Visit (INDEPENDENT_AMBULATORY_CARE_PROVIDER_SITE_OTHER): Payer: Medicaid Other | Admitting: Cardiology

## 2018-06-14 VITALS — BP 120/66 | HR 107 | Ht 63.0 in | Wt 266.8 lb

## 2018-06-14 DIAGNOSIS — E119 Type 2 diabetes mellitus without complications: Secondary | ICD-10-CM

## 2018-06-14 DIAGNOSIS — Z720 Tobacco use: Secondary | ICD-10-CM

## 2018-06-14 DIAGNOSIS — I10 Essential (primary) hypertension: Secondary | ICD-10-CM

## 2018-06-14 DIAGNOSIS — R0609 Other forms of dyspnea: Secondary | ICD-10-CM | POA: Diagnosis not present
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
## 2018-06-14 NOTE — Patient Instructions (Signed)
Medication Instructions:  The current medical regimen is effective;  continue present plan and medications.  Follow-Up: Follow up as needed with Dr Skains.  Thank you for choosing [facility]!!     

## 2018-06-18 ENCOUNTER — Institutional Professional Consult (permissible substitution): Payer: Medicaid Other | Admitting: Internal Medicine

## 2018-06-21 ENCOUNTER — Institutional Professional Consult (permissible substitution): Payer: Medicaid Other | Admitting: Internal Medicine

## 2018-07-13 ENCOUNTER — Encounter: Payer: Self-pay | Admitting: Pulmonary Disease

## 2018-07-13 ENCOUNTER — Institutional Professional Consult (permissible substitution): Payer: Medicaid Other | Admitting: Pulmonary Disease

## 2018-07-13 DIAGNOSIS — Z6841 Body Mass Index (BMI) 40.0 and over, adult: Secondary | ICD-10-CM

## 2018-07-13 DIAGNOSIS — E66813 Obesity, class 3: Secondary | ICD-10-CM | POA: Insufficient documentation

## 2018-07-13 HISTORY — DX: Body Mass Index (BMI) 40.0 and over, adult: Z684
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
## 2018-08-10 ENCOUNTER — Institutional Professional Consult (permissible substitution): Payer: Medicaid Other | Admitting: Pulmonary Disease
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
## 2020-04-14 ENCOUNTER — Other Ambulatory Visit: Payer: Self-pay

## 2020-04-14 ENCOUNTER — Emergency Department (HOSPITAL_COMMUNITY): Payer: Medicaid Other

## 2020-04-14 ENCOUNTER — Encounter (HOSPITAL_COMMUNITY): Payer: Self-pay | Admitting: Emergency Medicine

## 2020-04-14 ENCOUNTER — Emergency Department (HOSPITAL_COMMUNITY)
Admission: EM | Admit: 2020-04-14 | Discharge: 2020-04-14 | Disposition: A | Discharge status: Home / Self Care | Payer: Medicaid Other | Attending: Emergency Medicine | Admitting: Emergency Medicine

## 2020-04-14 DIAGNOSIS — I110 Hypertensive heart disease with heart failure: Secondary | ICD-10-CM | POA: Insufficient documentation

## 2020-04-14 DIAGNOSIS — J45909 Unspecified asthma, uncomplicated: Secondary | ICD-10-CM | POA: Insufficient documentation

## 2020-04-14 DIAGNOSIS — Z79899 Other long term (current) drug therapy: Secondary | ICD-10-CM | POA: Insufficient documentation

## 2020-04-14 DIAGNOSIS — M79606 Pain in leg, unspecified: Secondary | ICD-10-CM | POA: Diagnosis present

## 2020-04-14 DIAGNOSIS — I5033 Acute on chronic diastolic (congestive) heart failure: Secondary | ICD-10-CM | POA: Diagnosis not present

## 2020-04-14 DIAGNOSIS — E119 Type 2 diabetes mellitus without complications: Secondary | ICD-10-CM | POA: Insufficient documentation

## 2020-04-14 DIAGNOSIS — R52 Pain, unspecified: Secondary | ICD-10-CM

## 2020-04-14 DIAGNOSIS — M5442 Lumbago with sciatica, left side: Secondary | ICD-10-CM

## 2020-04-14 DIAGNOSIS — M5432 Sciatica, left side: Secondary | ICD-10-CM | POA: Insufficient documentation

## 2020-04-14 DIAGNOSIS — Z7984 Long term (current) use of oral hypoglycemic drugs: Secondary | ICD-10-CM | POA: Diagnosis not present

## 2020-04-14 DIAGNOSIS — F17210 Nicotine dependence, cigarettes, uncomplicated: Secondary | ICD-10-CM | POA: Insufficient documentation

## 2020-04-14 MED ORDER — METHOCARBAMOL 500 MG PO TABS: 500.0000 mg | ORAL_TABLET | Freq: Two times a day (BID) | ORAL | 0 refills | Status: DC

## 2020-04-14 MED ORDER — HYDROCODONE-ACETAMINOPHEN 5-325 MG PO TABS: 1.0000 | ORAL_TABLET | ORAL | 0 refills | Status: DC | PRN

## 2020-04-14 MED ORDER — HYDROCODONE-ACETAMINOPHEN 5-325 MG PO TABS
1.0000 | ORAL_TABLET | Freq: Once | ORAL | Status: AC
  Administered 2020-04-14: 1 via ORAL
  Filled 2020-04-14: qty 1

## 2020-04-14 MED ORDER — NAPROXEN 500 MG PO TABS: 500.0000 mg | ORAL_TABLET | Freq: Two times a day (BID) | ORAL | 0 refills | Status: DC

## 2020-04-14 MED ORDER — LIDOCAINE 5 % EX PTCH
1.0000 | MEDICATED_PATCH | CUTANEOUS | Status: DC
  Administered 2020-04-14: 1 via TRANSDERMAL
  Filled 2020-04-14: qty 1

## 2020-04-14 MED ORDER — LIDOCAINE 5 % EX PTCH: 1.0000 | MEDICATED_PATCH | CUTANEOUS | 0 refills | Status: DC

## 2020-04-14 MED ORDER — KETOROLAC TROMETHAMINE 15 MG/ML IJ SOLN
15.0000 mg | Freq: Once | INTRAMUSCULAR | Status: AC
  Administered 2020-04-14: 15 mg via INTRAMUSCULAR
  Filled 2020-04-14: qty 1
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
## 2020-04-14 NOTE — ED Notes (Signed)
Delay in discharge due to needing her phone to charge to call a ride home and she is borrowing Building control surveyor

## 2020-04-14 NOTE — ED Triage Notes (Signed)
Per EMS, patient from home, c/o left leg pain radiating from hip down leg x3 days. Denies injury. Reports pain worsens with movement. Ambulatory with cane at baseline.

## 2020-04-14 NOTE — Discharge Instructions (Signed)
Take the medications as prescribed. ° °Follow-up with orthopedics. ° °Return for new or worsening symptoms. °

## 2020-09-18 ENCOUNTER — Ambulatory Visit (INDEPENDENT_AMBULATORY_CARE_PROVIDER_SITE_OTHER): Payer: Medicaid Other

## 2020-09-18 ENCOUNTER — Encounter (HOSPITAL_COMMUNITY): Payer: Self-pay

## 2020-09-18 ENCOUNTER — Ambulatory Visit (HOSPITAL_COMMUNITY)
Admission: EM | Admit: 2020-09-18 | Discharge: 2020-09-18 | Disposition: A | Discharge status: Home / Self Care | Payer: Medicaid Other | Attending: Family Medicine | Admitting: Family Medicine

## 2020-09-18 ENCOUNTER — Other Ambulatory Visit: Payer: Self-pay

## 2020-09-18 DIAGNOSIS — J189 Pneumonia, unspecified organism: Secondary | ICD-10-CM | POA: Diagnosis not present

## 2020-09-18 DIAGNOSIS — U071 COVID-19: Secondary | ICD-10-CM | POA: Diagnosis not present

## 2020-09-18 DIAGNOSIS — J1282 Pneumonia due to coronavirus disease 2019: Secondary | ICD-10-CM | POA: Insufficient documentation

## 2020-09-18 DIAGNOSIS — R0602 Shortness of breath: Secondary | ICD-10-CM

## 2020-09-18 DIAGNOSIS — R059 Cough, unspecified: Secondary | ICD-10-CM | POA: Diagnosis not present

## 2020-09-18 LAB — CBG MONITORING, ED
Glucose-Capillary: 127 mg/dL — ABNORMAL HIGH (ref 70–99)
Glucose-Capillary: 137 mg/dL — ABNORMAL HIGH (ref 70–99)

## 2020-09-18 MED ORDER — DOXYCYCLINE HYCLATE 100 MG PO CAPS: 100.0000 mg | ORAL_CAPSULE | Freq: Two times a day (BID) | ORAL | 0 refills | Status: DC

## 2020-09-18 NOTE — ED Triage Notes (Signed)
Pt c/o productive cough with green and yellow sputum, congestion, sneezing, chills, SOB for approx 2 weeks.  Last used nebulizer two days ago.  Has not checked glucose for approx 2 weeks. Has been using robitussin, teas, and cough drops.  Bilateral lung sounds coarse.

## 2020-09-19 LAB — SARS CORONAVIRUS 2 (TAT 6-24 HRS): SARS Coronavirus 2: POSITIVE — AB
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
## 2020-09-21 ENCOUNTER — Ambulatory Visit (HOSPITAL_COMMUNITY)
Admission: EM | Admit: 2020-09-21 | Discharge: 2020-09-21 | Disposition: A | Discharge status: Home / Self Care | Payer: Medicaid Other | Attending: Student | Admitting: Student

## 2020-09-21 ENCOUNTER — Other Ambulatory Visit: Payer: Self-pay

## 2020-09-21 ENCOUNTER — Encounter (HOSPITAL_COMMUNITY): Payer: Self-pay | Admitting: Emergency Medicine

## 2020-09-21 DIAGNOSIS — U071 COVID-19: Secondary | ICD-10-CM

## 2020-09-21 DIAGNOSIS — G8929 Other chronic pain: Secondary | ICD-10-CM | POA: Diagnosis not present

## 2020-09-21 DIAGNOSIS — J1282 Pneumonia due to coronavirus disease 2019: Secondary | ICD-10-CM

## 2020-09-21 DIAGNOSIS — M5442 Lumbago with sciatica, left side: Secondary | ICD-10-CM | POA: Diagnosis not present

## 2020-09-21 MED ORDER — METAXALONE 800 MG PO TABS: 800.0000 mg | ORAL_TABLET | Freq: Three times a day (TID) | ORAL | 0 refills | Status: DC
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
## 2020-09-21 NOTE — Discharge Instructions (Addendum)
--  Follow-up with your primary care provider for refill of your oxycydone.  -Try the Metaxalone, up to 3x daily for back pain. Make sure not to take this with any other muscle relaxers. This medication can make you drowsy, so don't take this before driving, operating machinery, etc.  -Continue doxycycline -seek additional medical attention if you experience chest pain, shortness of breath, new/worsening fevers/chills, confusion, worsening of symptoms despite the above treatment plan, etc.

## 2020-09-21 NOTE — ED Triage Notes (Signed)
Patient c/o RT leg pain x 2 months.   Patient endorses that she was going to have an appointment with primary care but will be unable to go for appointment due to weather and she needs some medications for pain.   Patient is ambulatory.   Patient is COVID positive.

## 2022-01-13 ENCOUNTER — Inpatient Hospital Stay (HOSPITAL_COMMUNITY): Payer: Medicaid Other

## 2022-01-13 ENCOUNTER — Emergency Department (HOSPITAL_COMMUNITY): Payer: Medicaid Other

## 2022-01-13 ENCOUNTER — Encounter (HOSPITAL_COMMUNITY): Payer: Self-pay | Admitting: Emergency Medicine

## 2022-01-13 ENCOUNTER — Inpatient Hospital Stay (HOSPITAL_COMMUNITY)
Admission: EM | Admit: 2022-01-13 | Discharge: 2022-01-17 | DRG: 208 | Disposition: A | Discharge status: Home / Self Care | Payer: Medicaid Other | Attending: Student | Admitting: Student

## 2022-01-13 ENCOUNTER — Other Ambulatory Visit: Payer: Self-pay

## 2022-01-13 DIAGNOSIS — M549 Dorsalgia, unspecified: Secondary | ICD-10-CM | POA: Diagnosis present

## 2022-01-13 DIAGNOSIS — E8729 Other acidosis: Secondary | ICD-10-CM | POA: Diagnosis present

## 2022-01-13 DIAGNOSIS — E7800 Pure hypercholesterolemia, unspecified: Secondary | ICD-10-CM | POA: Diagnosis present

## 2022-01-13 DIAGNOSIS — J9811 Atelectasis: Secondary | ICD-10-CM | POA: Diagnosis present

## 2022-01-13 DIAGNOSIS — I10 Essential (primary) hypertension: Secondary | ICD-10-CM | POA: Diagnosis not present

## 2022-01-13 DIAGNOSIS — I110 Hypertensive heart disease with heart failure: Secondary | ICD-10-CM | POA: Diagnosis present

## 2022-01-13 DIAGNOSIS — I5030 Unspecified diastolic (congestive) heart failure: Secondary | ICD-10-CM

## 2022-01-13 DIAGNOSIS — J9601 Acute respiratory failure with hypoxia: Secondary | ICD-10-CM

## 2022-01-13 DIAGNOSIS — J440 Chronic obstructive pulmonary disease with acute lower respiratory infection: Secondary | ICD-10-CM | POA: Diagnosis present

## 2022-01-13 DIAGNOSIS — Z713 Dietary counseling and surveillance: Secondary | ICD-10-CM

## 2022-01-13 DIAGNOSIS — F419 Anxiety disorder, unspecified: Secondary | ICD-10-CM | POA: Diagnosis present

## 2022-01-13 DIAGNOSIS — B970 Adenovirus as the cause of diseases classified elsewhere: Secondary | ICD-10-CM | POA: Diagnosis present

## 2022-01-13 DIAGNOSIS — Z83438 Family history of other disorder of lipoprotein metabolism and other lipidemia: Secondary | ICD-10-CM

## 2022-01-13 DIAGNOSIS — I5032 Chronic diastolic (congestive) heart failure: Secondary | ICD-10-CM | POA: Diagnosis present

## 2022-01-13 DIAGNOSIS — J9621 Acute and chronic respiratory failure with hypoxia: Secondary | ICD-10-CM | POA: Diagnosis present

## 2022-01-13 DIAGNOSIS — Z7984 Long term (current) use of oral hypoglycemic drugs: Secondary | ICD-10-CM | POA: Diagnosis not present

## 2022-01-13 DIAGNOSIS — F17210 Nicotine dependence, cigarettes, uncomplicated: Secondary | ICD-10-CM | POA: Diagnosis present

## 2022-01-13 DIAGNOSIS — J9690 Respiratory failure, unspecified, unspecified whether with hypoxia or hypercapnia: Secondary | ICD-10-CM | POA: Diagnosis present

## 2022-01-13 DIAGNOSIS — J189 Pneumonia, unspecified organism: Secondary | ICD-10-CM | POA: Diagnosis present

## 2022-01-13 DIAGNOSIS — J9622 Acute and chronic respiratory failure with hypercapnia: Secondary | ICD-10-CM | POA: Diagnosis present

## 2022-01-13 DIAGNOSIS — G9340 Encephalopathy, unspecified: Secondary | ICD-10-CM

## 2022-01-13 DIAGNOSIS — R0902 Hypoxemia: Secondary | ICD-10-CM

## 2022-01-13 DIAGNOSIS — G9341 Metabolic encephalopathy: Secondary | ICD-10-CM

## 2022-01-13 DIAGNOSIS — F329 Major depressive disorder, single episode, unspecified: Secondary | ICD-10-CM

## 2022-01-13 DIAGNOSIS — B340 Adenovirus infection, unspecified: Secondary | ICD-10-CM

## 2022-01-13 DIAGNOSIS — Z6841 Body Mass Index (BMI) 40.0 and over, adult: Secondary | ICD-10-CM | POA: Diagnosis not present

## 2022-01-13 DIAGNOSIS — Z20822 Contact with and (suspected) exposure to covid-19: Secondary | ICD-10-CM | POA: Diagnosis present

## 2022-01-13 DIAGNOSIS — D751 Secondary polycythemia: Secondary | ICD-10-CM | POA: Diagnosis present

## 2022-01-13 DIAGNOSIS — J441 Chronic obstructive pulmonary disease with (acute) exacerbation: Principal | ICD-10-CM | POA: Diagnosis present

## 2022-01-13 DIAGNOSIS — J9602 Acute respiratory failure with hypercapnia: Secondary | ICD-10-CM

## 2022-01-13 DIAGNOSIS — R0609 Other forms of dyspnea: Secondary | ICD-10-CM | POA: Diagnosis not present

## 2022-01-13 DIAGNOSIS — Z833 Family history of diabetes mellitus: Secondary | ICD-10-CM | POA: Diagnosis not present

## 2022-01-13 DIAGNOSIS — G8929 Other chronic pain: Secondary | ICD-10-CM | POA: Diagnosis present

## 2022-01-13 DIAGNOSIS — G928 Other toxic encephalopathy: Secondary | ICD-10-CM | POA: Diagnosis not present

## 2022-01-13 DIAGNOSIS — F32A Depression, unspecified: Secondary | ICD-10-CM | POA: Diagnosis present

## 2022-01-13 DIAGNOSIS — F17200 Nicotine dependence, unspecified, uncomplicated: Secondary | ICD-10-CM | POA: Diagnosis not present

## 2022-01-13 DIAGNOSIS — E119 Type 2 diabetes mellitus without complications: Secondary | ICD-10-CM

## 2022-01-13 DIAGNOSIS — E785 Hyperlipidemia, unspecified: Secondary | ICD-10-CM

## 2022-01-13 DIAGNOSIS — Z7952 Long term (current) use of systemic steroids: Secondary | ICD-10-CM

## 2022-01-13 DIAGNOSIS — M545 Low back pain, unspecified: Secondary | ICD-10-CM | POA: Diagnosis not present

## 2022-01-13 DIAGNOSIS — E1165 Type 2 diabetes mellitus with hyperglycemia: Secondary | ICD-10-CM | POA: Diagnosis present

## 2022-01-13 DIAGNOSIS — Z716 Tobacco abuse counseling: Secondary | ICD-10-CM

## 2022-01-13 DIAGNOSIS — Z79899 Other long term (current) drug therapy: Secondary | ICD-10-CM

## 2022-01-13 DIAGNOSIS — T380X5A Adverse effect of glucocorticoids and synthetic analogues, initial encounter: Secondary | ICD-10-CM | POA: Diagnosis present

## 2022-01-13 LAB — BLOOD GAS, ARTERIAL
Acid-Base Excess: 3.8 mmol/L — ABNORMAL HIGH (ref 0.0–2.0)
Bicarbonate: 35.5 mmol/L — ABNORMAL HIGH (ref 20.0–28.0)
Delivery systems: POSITIVE
Drawn by: 42261
Expiratory PAP: 6 cmH2O
FIO2: 45 %
Inspiratory PAP: 20 cmH2O
O2 Saturation: 98.2 %
Patient temperature: 37.1
pCO2 arterial: 95 mmHg (ref 32–48)
pH, Arterial: 7.18 — CL (ref 7.35–7.45)
pO2, Arterial: 105 mmHg (ref 83–108)

## 2022-01-13 LAB — COMPREHENSIVE METABOLIC PANEL
ALT: 10 U/L (ref 0–44)
AST: 17 U/L (ref 15–41)
Albumin: 3.8 g/dL (ref 3.5–5.0)
Alkaline Phosphatase: 72 U/L (ref 38–126)
Anion gap: 9 (ref 5–15)
BUN: 8 mg/dL (ref 8–23)
CO2: 33 mmol/L — ABNORMAL HIGH (ref 22–32)
Calcium: 9.3 mg/dL (ref 8.9–10.3)
Chloride: 91 mmol/L — ABNORMAL LOW (ref 98–111)
Creatinine, Ser: 0.68 mg/dL (ref 0.44–1.00)
GFR, Estimated: >60 mL/min (ref >60)
Glucose, Bld: 211 mg/dL — ABNORMAL HIGH (ref 70–99)
Potassium: 4.5 mmol/L (ref 3.5–5.1)
Sodium: 133 mmol/L — ABNORMAL LOW (ref 135–145)
Total Bilirubin: 0.7 mg/dL (ref 0.3–1.2)
Total Protein: 7.7 g/dL (ref 6.5–8.1)

## 2022-01-13 LAB — GLUCOSE, CAPILLARY: Glucose-Capillary: 230 mg/dL — ABNORMAL HIGH (ref 70–99)

## 2022-01-13 LAB — CBC WITH DIFFERENTIAL/PLATELET
Abs Immature Granulocytes: 0.03 10*3/uL (ref 0.00–0.07)
Basophils Absolute: 0 10*3/uL (ref 0.0–0.1)
Basophils Relative: 0 %
Eosinophils Absolute: 0 10*3/uL (ref 0.0–0.5)
Eosinophils Relative: 0 %
HCT: 52.5 % — ABNORMAL HIGH (ref 36.0–46.0)
Hemoglobin: 16.6 g/dL — ABNORMAL HIGH (ref 12.0–15.0)
Immature Granulocytes: 0 %
Lymphocytes Relative: 21 %
Lymphs Abs: 1.5 10*3/uL (ref 0.7–4.0)
MCH: 27.9 pg (ref 26.0–34.0)
MCHC: 31.6 g/dL (ref 30.0–36.0)
MCV: 88.1 fL (ref 80.0–100.0)
Monocytes Absolute: 0.7 10*3/uL (ref 0.1–1.0)
Monocytes Relative: 9 %
Neutro Abs: 5 10*3/uL (ref 1.7–7.7)
Neutrophils Relative %: 70 %
Platelets: 228 10*3/uL (ref 150–400)
RBC: 5.96 MIL/uL — ABNORMAL HIGH (ref 3.87–5.11)
RDW: 14.5 % (ref 11.5–15.5)
WBC: 7.1 10*3/uL (ref 4.0–10.5)
nRBC: 0 % (ref 0.0–0.2)

## 2022-01-13 LAB — BLOOD GAS, VENOUS
Acid-Base Excess: 5 mmol/L — ABNORMAL HIGH (ref 0.0–2.0)
Bicarbonate: 35.1 mmol/L — ABNORMAL HIGH (ref 20.0–28.0)
O2 Saturation: 93.9 %
Patient temperature: 37
pCO2, Ven: 80 mmHg (ref 44–60)
pH, Ven: 7.25 (ref 7.25–7.43)
pO2, Ven: 68 mmHg — ABNORMAL HIGH (ref 32–45)

## 2022-01-13 LAB — RESP PANEL BY RT-PCR (FLU A&B, COVID) ARPGX2
Influenza A by PCR: NEGATIVE
Influenza B by PCR: NEGATIVE
SARS Coronavirus 2 by RT PCR: NEGATIVE

## 2022-01-13 LAB — CBG MONITORING, ED: Glucose-Capillary: 247 mg/dL — ABNORMAL HIGH (ref 70–99)

## 2022-01-13 LAB — PROTIME-INR
INR: 1 (ref 0.8–1.2)
Prothrombin Time: 13.2 seconds (ref 11.4–15.2)

## 2022-01-13 LAB — LACTIC ACID, PLASMA: Lactic Acid, Venous: 1.3 mmol/L (ref 0.5–1.9)

## 2022-01-13 MED ORDER — IPRATROPIUM-ALBUTEROL 0.5-2.5 (3) MG/3ML IN SOLN
3.0000 mL | Freq: Four times a day (QID) | RESPIRATORY_TRACT | Status: DC
  Administered 2022-01-14 – 2022-01-15 (×6): 3 mL via RESPIRATORY_TRACT
  Filled 2022-01-13 (×6): qty 3

## 2022-01-13 MED ORDER — SODIUM CHLORIDE 0.9 % IV SOLN
500.0000 mg | Freq: Once | INTRAVENOUS | Status: AC
  Administered 2022-01-13: 500 mg via INTRAVENOUS
  Filled 2022-01-13: qty 5

## 2022-01-13 MED ORDER — SODIUM CHLORIDE 0.9 % IV BOLUS
1000.0000 mL | Freq: Once | INTRAVENOUS | Status: AC
  Administered 2022-01-13: 1000 mL via INTRAVENOUS

## 2022-01-13 MED ORDER — POLYETHYLENE GLYCOL 3350 17 G PO PACK
17.0000 g | PACK | Freq: Every day | ORAL | Status: DC | PRN
Start: 2022-01-13 — End: 2022-01-18
  Administered 2022-01-16: 17 g via ORAL
  Filled 2022-01-13: qty 1

## 2022-01-13 MED ORDER — ROCURONIUM BROMIDE 10 MG/ML (PF) SYRINGE
100.0000 mg | PREFILLED_SYRINGE | Freq: Once | INTRAVENOUS | Status: AC
  Administered 2022-01-13: 100 mg via INTRAVENOUS

## 2022-01-13 MED ORDER — FENTANYL CITRATE PF 50 MCG/ML IJ SOSY
PREFILLED_SYRINGE | INTRAMUSCULAR | Status: AC
  Filled 2022-01-13: qty 2

## 2022-01-13 MED ORDER — CHLORHEXIDINE GLUCONATE 0.12% ORAL RINSE (MEDLINE KIT)
15.0000 mL | Freq: Two times a day (BID) | OROMUCOSAL | Status: DC
  Administered 2022-01-13 – 2022-01-17 (×7): 15 mL via OROMUCOSAL

## 2022-01-13 MED ORDER — ACETAMINOPHEN 325 MG PO TABS: 650.0000 mg | ORAL_TABLET | Freq: Four times a day (QID) | ORAL | Status: DC | PRN

## 2022-01-13 MED ORDER — ETOMIDATE 2 MG/ML IV SOLN
20.0000 mg | Freq: Once | INTRAVENOUS | Status: AC
  Administered 2022-01-13: 20 mg via INTRAVENOUS

## 2022-01-13 MED ORDER — CHLORHEXIDINE GLUCONATE 0.12% ORAL RINSE (MEDLINE KIT): 15.0000 mL | Freq: Two times a day (BID) | OROMUCOSAL | Status: DC

## 2022-01-13 MED ORDER — DEXMEDETOMIDINE HCL IN NACL 200 MCG/50ML IV SOLN
0.0000 ug/kg/h | INTRAVENOUS | Status: DC
  Administered 2022-01-13 – 2022-01-14 (×2): 0.4 ug/kg/h via INTRAVENOUS
  Administered 2022-01-14: 0.5 ug/kg/h via INTRAVENOUS
  Administered 2022-01-14 (×2): 0.3 ug/kg/h via INTRAVENOUS
  Administered 2022-01-14: 0.4 ug/kg/h via INTRAVENOUS
  Administered 2022-01-15: 0.3 ug/kg/h via INTRAVENOUS
  Filled 2022-01-13 (×2): qty 50
  Filled 2022-01-13: qty 100
  Filled 2022-01-13 (×3): qty 50

## 2022-01-13 MED ORDER — PANTOPRAZOLE 2 MG/ML SUSPENSION
40.0000 mg | Freq: Every day | ORAL | Status: DC
  Administered 2022-01-14: 40 mg
  Filled 2022-01-13: qty 20

## 2022-01-13 MED ORDER — MIDAZOLAM HCL 2 MG/2ML IJ SOLN
INTRAMUSCULAR | Status: AC
  Filled 2022-01-13: qty 2

## 2022-01-13 MED ORDER — INSULIN ASPART 100 UNIT/ML IJ SOLN
0.0000 [IU] | INTRAMUSCULAR | Status: DC
  Administered 2022-01-13: 7 [IU] via SUBCUTANEOUS
  Administered 2022-01-14 (×3): 4 [IU] via SUBCUTANEOUS
  Administered 2022-01-14 (×2): 7 [IU] via SUBCUTANEOUS
  Administered 2022-01-14: 11 [IU] via SUBCUTANEOUS
  Administered 2022-01-15: 4 [IU] via SUBCUTANEOUS
  Administered 2022-01-15: 7 [IU] via SUBCUTANEOUS
  Filled 2022-01-13: qty 0.2

## 2022-01-13 MED ORDER — PRAVASTATIN SODIUM 20 MG PO TABS: 40.0000 mg | ORAL_TABLET | Freq: Every day | ORAL | Status: DC

## 2022-01-13 MED ORDER — ALBUTEROL SULFATE (2.5 MG/3ML) 0.083% IN NEBU: 2.5000 mg | INHALATION_SOLUTION | RESPIRATORY_TRACT | Status: DC | PRN

## 2022-01-13 MED ORDER — PRAVASTATIN SODIUM 20 MG PO TABS
40.0000 mg | ORAL_TABLET | Freq: Every day | ORAL | Status: DC
  Administered 2022-01-14: 40 mg
  Filled 2022-01-13: qty 2

## 2022-01-13 MED ORDER — FENTANYL CITRATE PF 50 MCG/ML IJ SOSY
50.0000 ug | PREFILLED_SYRINGE | INTRAMUSCULAR | Status: DC | PRN
  Administered 2022-01-14 – 2022-01-15 (×8): 50 ug via INTRAVENOUS
  Filled 2022-01-13: qty 2
  Filled 2022-01-13 (×3): qty 1
  Filled 2022-01-13: qty 2
  Filled 2022-01-13 (×5): qty 1

## 2022-01-13 MED ORDER — CEFTRIAXONE SODIUM 1 G IJ SOLR
1.0000 g | Freq: Once | INTRAMUSCULAR | Status: AC
Start: 2022-01-13 — End: 2022-01-13
  Administered 2022-01-13: 1 g via INTRAVENOUS
  Filled 2022-01-13: qty 10

## 2022-01-13 MED ORDER — VITAL HIGH PROTEIN PO LIQD
1000.0000 mL | ORAL | Status: DC
  Administered 2022-01-14: 1000 mL
  Filled 2022-01-13: qty 1000

## 2022-01-13 MED ORDER — ENOXAPARIN SODIUM 60 MG/0.6ML IJ SOSY
50.0000 mg | PREFILLED_SYRINGE | INTRAMUSCULAR | Status: DC
  Administered 2022-01-13: 50 mg via SUBCUTANEOUS
  Filled 2022-01-13: qty 0.5

## 2022-01-13 MED ORDER — SODIUM CHLORIDE 0.9 % IV SOLN
500.0000 mg | Freq: Every day | INTRAVENOUS | Status: DC
  Administered 2022-01-14 – 2022-01-16 (×3): 500 mg via INTRAVENOUS
  Filled 2022-01-13 (×3): qty 5

## 2022-01-13 MED ORDER — SUCCINYLCHOLINE CHLORIDE 200 MG/10ML IV SOSY
PREFILLED_SYRINGE | INTRAVENOUS | Status: AC
  Filled 2022-01-13: qty 10

## 2022-01-13 MED ORDER — INSULIN ASPART 100 UNIT/ML IJ SOLN
0.0000 [IU] | Freq: Every day | INTRAMUSCULAR | Status: DC
  Filled 2022-01-13: qty 0.05

## 2022-01-13 MED ORDER — HYDRALAZINE HCL 20 MG/ML IJ SOLN
5.0000 mg | Freq: Four times a day (QID) | INTRAMUSCULAR | Status: DC | PRN
  Administered 2022-01-16: 5 mg via INTRAVENOUS

## 2022-01-13 MED ORDER — ALBUTEROL SULFATE HFA 108 (90 BASE) MCG/ACT IN AERS
2.0000 | INHALATION_SPRAY | RESPIRATORY_TRACT | Status: DC | PRN
  Filled 2022-01-13: qty 6.7

## 2022-01-13 MED ORDER — SODIUM CHLORIDE 0.9 % IV SOLN: INTRAVENOUS | Status: DC

## 2022-01-13 MED ORDER — ORAL CARE MOUTH RINSE
15.0000 mL | OROMUCOSAL | Status: DC
  Administered 2022-01-14 – 2022-01-15 (×12): 15 mL via OROMUCOSAL

## 2022-01-13 MED ORDER — ACETAMINOPHEN 325 MG PO TABS
650.0000 mg | ORAL_TABLET | Freq: Once | ORAL | Status: AC
  Administered 2022-01-13: 650 mg via ORAL
  Filled 2022-01-13: qty 2

## 2022-01-13 MED ORDER — METHYLPREDNISOLONE SODIUM SUCC 40 MG IJ SOLR
40.0000 mg | Freq: Two times a day (BID) | INTRAMUSCULAR | Status: DC
  Administered 2022-01-13: 40 mg via INTRAVENOUS
  Filled 2022-01-13: qty 1

## 2022-01-13 MED ORDER — INSULIN ASPART 100 UNIT/ML IJ SOLN
0.0000 [IU] | INTRAMUSCULAR | Status: DC
  Filled 2022-01-13: qty 0.2

## 2022-01-13 MED ORDER — SODIUM CHLORIDE 0.9 % IV SOLN
2.0000 g | INTRAVENOUS | Status: DC
  Filled 2022-01-13: qty 20

## 2022-01-13 MED ORDER — CHLORHEXIDINE GLUCONATE CLOTH 2 % EX PADS
6.0000 | MEDICATED_PAD | Freq: Every day | CUTANEOUS | Status: DC
  Administered 2022-01-13 – 2022-01-15 (×2): 6 via TOPICAL

## 2022-01-13 MED ORDER — ORAL CARE MOUTH RINSE: 15.0000 mL | OROMUCOSAL | Status: DC

## 2022-01-13 MED ORDER — NICOTINE 14 MG/24HR TD PT24
14.0000 mg | MEDICATED_PATCH | Freq: Every day | TRANSDERMAL | Status: DC
Start: 2022-01-13 — End: 2022-01-18
  Administered 2022-01-13 – 2022-01-17 (×5): 14 mg via TRANSDERMAL
  Filled 2022-01-13 (×5): qty 1

## 2022-01-13 MED ORDER — POLYETHYLENE GLYCOL 3350 17 G PO PACK
17.0000 g | PACK | Freq: Every day | ORAL | Status: DC
  Administered 2022-01-14: 17 g
  Filled 2022-01-13: qty 1

## 2022-01-13 MED ORDER — DOCUSATE SODIUM 50 MG/5ML PO LIQD
100.0000 mg | Freq: Two times a day (BID) | ORAL | Status: DC
  Administered 2022-01-14 (×2): 100 mg
  Filled 2022-01-13 (×2): qty 10

## 2022-01-13 MED ORDER — SODIUM CHLORIDE 0.9 % IV SOLN: 1.0000 g | Freq: Once | INTRAVENOUS | Status: DC

## 2022-01-13 MED ORDER — METHYLPREDNISOLONE SODIUM SUCC 125 MG IJ SOLR
125.0000 mg | Freq: Once | INTRAMUSCULAR | Status: AC
Start: 2022-01-13 — End: 2022-01-13
  Administered 2022-01-13: 125 mg via INTRAVENOUS
  Filled 2022-01-13: qty 2

## 2022-01-13 MED ORDER — ROCURONIUM BROMIDE 10 MG/ML (PF) SYRINGE
PREFILLED_SYRINGE | INTRAVENOUS | Status: AC
Start: 2022-01-13 — End: 2022-01-14
  Filled 2022-01-13: qty 10

## 2022-01-13 MED ORDER — INSULIN ASPART 100 UNIT/ML IJ SOLN
0.0000 [IU] | Freq: Three times a day (TID) | INTRAMUSCULAR | Status: DC
  Filled 2022-01-13: qty 0.2

## 2022-01-13 MED ORDER — ONDANSETRON HCL 4 MG/2ML IJ SOLN: 4.0000 mg | Freq: Four times a day (QID) | INTRAMUSCULAR | Status: DC | PRN

## 2022-01-13 MED ORDER — AMLODIPINE BESYLATE 10 MG PO TABS
10.0000 mg | ORAL_TABLET | Freq: Every day | ORAL | Status: DC
  Administered 2022-01-14: 10 mg
  Filled 2022-01-13: qty 1

## 2022-01-13 MED ORDER — KETAMINE HCL 50 MG/5ML IJ SOSY
PREFILLED_SYRINGE | INTRAMUSCULAR | Status: AC
  Filled 2022-01-13: qty 5

## 2022-01-13 MED ORDER — LORAZEPAM 2 MG/ML IJ SOLN
1.0000 mg | Freq: Once | INTRAMUSCULAR | Status: AC
Start: 2022-01-13 — End: 2022-01-13
  Administered 2022-01-13: 1 mg via INTRAVENOUS
  Filled 2022-01-13: qty 1

## 2022-01-13 MED ORDER — MELATONIN 3 MG PO TABS: 3.0000 mg | ORAL_TABLET | Freq: Every evening | ORAL | Status: DC | PRN

## 2022-01-13 MED ORDER — ETOMIDATE 2 MG/ML IV SOLN
INTRAVENOUS | Status: AC
  Filled 2022-01-13: qty 20

## 2022-01-13 MED ORDER — MIDAZOLAM HCL 2 MG/2ML IJ SOLN
2.0000 mg | INTRAMUSCULAR | Status: DC | PRN
  Filled 2022-01-13: qty 2

## 2022-01-13 MED ORDER — AMLODIPINE BESYLATE 5 MG PO TABS: 10.0000 mg | ORAL_TABLET | Freq: Every day | ORAL | Status: DC

## 2022-01-13 MED ORDER — GUAIFENESIN-DM 100-10 MG/5ML PO SYRP: 5.0000 mL | ORAL_SOLUTION | ORAL | Status: DC | PRN

## 2022-01-13 MED ORDER — IPRATROPIUM-ALBUTEROL 0.5-2.5 (3) MG/3ML IN SOLN
3.0000 mL | Freq: Four times a day (QID) | RESPIRATORY_TRACT | Status: DC
  Administered 2022-01-13: 3 mL via RESPIRATORY_TRACT
  Filled 2022-01-13: qty 3

## 2022-01-13 NOTE — ED Notes (Signed)
Patient very confuse and unable to redirect. Pt keep removing o2. Respiratory called. Patient Started on BIPAP. ?

## 2022-01-13 NOTE — ED Notes (Signed)
Patient unable to tolerate BIPAP. Pulling lines. EDP informed. Ativan Given. Will continue to monitor. ?

## 2022-01-13 NOTE — ED Provider Notes (Addendum)
?[facility]-EMERGENCY DEPT ?Provider Note ? ? ?CSN: [id number] ?Arrival date & time: 01/13/22  1645 ? ?  ? ?History ? ?Chief Complaint  ?Patient presents with  ? Shortness of Breath  ? ? ?Sandra Solis is a 61 y.o. female hx of COPD, DM, here with SOB.  Patient states that she lives at home with multiple grandkids.  Her grandkids recently has a viral infection and multiple grandkids are coughing.  She started coughing several days.  Patient states that she became more short of breath today so called EMS.  Patient normally do not wear oxygen at home and only wears it as needed.  Patient was noted to be hypoxic with sats 70% on room air per EMS.  Patient was put on 2 L nasal cannula. ? ?The history is provided by the patient.  ? ?  ? ?Home Medications ?Prior to Admission medications   ?Medication Sig Start Date End Date Taking? Authorizing Provider  ?albuterol (PROVENTIL HFA;VENTOLIN HFA) 108 (90 BASE) MCG/ACT inhaler Inhale 2 puffs into the lungs every 6 (six) hours as needed for wheezing or shortness of breath.    [provider]  ?amLODipine (NORVASC) 5 MG tablet Take 1 tablet (5 mg total) by mouth daily. 01/03/18   Eulah Pont, MD  ?doxycycline (VIBRAMYCIN) 100 MG capsule Take 1 capsule (100 mg total) by mouth 2 (two) times daily. 09/18/20   Mardella Layman, MD  ?glipiZIDE (GLUCOTROL) 10 MG tablet TAKE 1 TABLET BY MOUTH ONCE DAILY BEFORE A MEAL 06/07/18   [provider]  ?guaiFENesin (ROBITUSSIN) 100 MG/5ML liquid Take 5-10 mLs (100-200 mg total) by mouth every 4 (four) hours as needed for cough. 06/25/15   Marlon Pel, PA-C  ?HYDROcodone-acetaminophen (NORCO/VICODIN) 5-325 MG tablet Take 1 tablet by mouth every 4 (four) hours as needed. 04/14/20   Henderly, Britni A, PA-C  ?ipratropium-albuterol (DUONEB) 0.5-2.5 (3) MG/3ML SOLN Take 3 mLs by nebulization 3 (three) times daily. 01/02/18   Eulah Pont, MD  ?lidocaine (LIDODERM) 5 % Place 1 patch onto the skin daily. Remove & Discard  patch within 12 hours or as directed by MD 04/14/20   Henderly, Britni A, PA-C  ?lisinopril (PRINIVIL,ZESTRIL) 20 MG tablet Take 0.5 tablets (10 mg total) by mouth every morning. 01/02/18   Eulah Pont, MD  ?lisinopril-hydrochlorothiazide (PRINZIDE,ZESTORETIC) 20-25 MG tablet TAKE 1 TABLET BY MOUTH ONCE DAILY FOR 30 DAYS 06/07/18   [provider]  ?loratadine (CLARITIN) 10 MG tablet Take 1 tablet (10 mg total) by mouth daily. 01/03/18   Eulah Pont, MD  ?metaxalone (SKELAXIN) 800 MG tablet Take 1 tablet (800 mg total) by mouth 3 (three) times daily. 09/21/20   Rhys Martini, PA-C  ?metFORMIN (GLUCOPHAGE) 1000 MG tablet Take 1,000 mg by mouth 2 (two) times daily. 06/07/18   [provider]  ?naproxen (NAPROSYN) 500 MG tablet Take 1 tablet (500 mg total) by mouth 2 (two) times daily. 04/14/20   Henderly, Britni A, PA-C  ?pravastatin (PRAVACHOL) 40 MG tablet Take 40 mg by mouth daily. 06/07/18   [provider]  ?predniSONE (DELTASONE) 20 MG tablet Take 2 tablets (40 mg total) by mouth daily with breakfast. 01/03/18   Eulah Pont, MD  ?QUEtiapine (SEROQUEL) 50 MG tablet Take 50 mg by mouth at bedtime as needed (for sleep).    [provider]  ?sertraline (ZOLOFT) 100 MG tablet Take 50 mg by mouth at bedtime.    [provider]  ?sodium chloride (OCEAN) 0.65 % SOLN nasal spray  Place 1 spray into both nostrils as needed for congestion. 01/02/18   Eulah PontBlum, Nina, MD  ?tiotropium (SPIRIVA HANDIHALER) 18 MCG inhalation capsule Place 1 capsule (18 mcg total) into inhaler and inhale daily. 01/02/18 01/02/19  Eulah PontBlum, Nina, MD  ?   ? ?Allergies    ?Patient has no known allergies.   ? ?Review of Systems   ?Review of Systems  ?Respiratory:  Positive for shortness of breath.   ?All other systems reviewed and are negative. ? ?Physical Exam ?Updated Vital Signs ?BP (!) 167/138   Pulse (!) 127   Temp (!) 100.7 ?F (38.2 ?C) (Oral)   Resp 20   SpO2 93%  ?Physical Exam ?Vitals and nursing note reviewed.   ?Constitutional:   ?   Comments: Tachypneic   ?HENT:  ?   Head: Normocephalic.  ?   Mouth/Throat:  ?   Mouth: Mucous membranes are moist.  ?Eyes:  ?   Extraocular Movements: Extraocular movements intact.  ?   Pupils: Pupils are equal, round, and reactive to light.  ?Cardiovascular:  ?   Rate and Rhythm: Regular rhythm. Tachycardia present.  ?Pulmonary:  ?   Comments: Tachypneic, diminished bilaterally  ?Abdominal:  ?   General: Bowel sounds are normal.  ?   Palpations: Abdomen is soft.  ?Musculoskeletal:     ?   General: Normal range of motion.  ?   Cervical back: Normal range of motion and neck supple.  ?Skin: ?   General: Skin is warm.  ?Neurological:  ?   General: No focal deficit present.  ?   Mental Status: She is oriented to person, place, and time.  ?Psychiatric:     ?   Mood and Affect: Mood normal.     ?   Behavior: Behavior normal.  ? ? ?ED Results / Procedures / Treatments   ?Labs ?(all labs ordered are listed, but only abnormal results are displayed) ?Labs Reviewed  ?CBC WITH DIFFERENTIAL/PLATELET - Abnormal; Notable for the following components:  ?    Result Value  ? RBC 5.96 (*)   ? Hemoglobin 16.6 (*)   ? HCT 52.5 (*)   ? All other components within normal limits  ?RESP PANEL BY RT-PCR (FLU A&B, COVID) [id number]  ?CULTURE, BLOOD (ROUTINE X 2)  ?CULTURE, BLOOD (ROUTINE X 2)  ?LACTIC ACID, PLASMA  ?PROTIME-INR  ?COMPREHENSIVE METABOLIC PANEL  ?URINALYSIS, ROUTINE W REFLEX MICROSCOPIC  ?BLOOD GAS, VENOUS  ? ? ?EKG ?EKG Interpretation ? ?Date/Time:  Monday Jan 13 2022 16:58:11 EDT ?Ventricular Rate:  128 ?PR Interval:  134 ?QRS Duration: 87 ?QT Interval:  301 ?QTC Calculation: 440 ?R Axis:   25 ?Text Interpretation: Sinus tachycardia Probable left atrial enlargement RSR' in V1 or V2, right VCD or RVH Probable LVH with secondary repol abnrm Minimal ST elevation, inferior leads Since last tracing rate faster Confirmed by Richardean CanalYao, Arnika Larzelere H [phone] on 01/13/2022 5:43:26 PM ? ?Radiology ?DG Chest Port 1 View ? ?Result  Date: 01/13/2022 ?CLINICAL DATA:  Shortness of breath. EXAM: PORTABLE CHEST 1 VIEW COMPARISON:  09/18/2020 FINDINGS: Stable top-normal heart size and aortic atherosclerosis. Mild bibasilar atelectasis. No overt edema, airspace consolidation, pneumothorax or pleural fluid identified. Visualized bony structures are unremarkable. IMPRESSION: Bibasilar atelectasis. Electronically Signed   By: Irish LackGlenn  Yamagata M.D.   On: 01/13/2022 17:31   ? ?Procedures ?Procedures  ? ?INTUBATION ?Performed by: Richardean Canalavid H Josseline Reddin ? ?Required items: required blood products, implants, devices, and special equipment available ?Patient identity confirmed: provided demographic data and hospital-assigned identification  number ?Time out: Immediately prior to procedure a "time out" was called to verify the correct patient, procedure, equipment, support staff and site/side marked as required. ? ?Indications: hypoxia ? ?Intubation method: Glidescope Laryngoscopy  ? ?Preoxygenation: BVM ? ?Sedatives: 20 mg Etomidate ?Paralytic: 100 mg rocuronium  ? ?Tube Size: 7.5 cuffed ? ?Post-procedure assessment: chest rise and ETCO2 monitor ?Breath sounds: equal and absent over the epigastrium ?Tube secured with: ETT holder ?Chest x-ray interpreted by radiologist and me. ? ?Chest x-ray findings: endotracheal tube in appropriate position ? ?Patient tolerated the procedure well with no immediate complications. ? ? ? ?Medications Ordered in ED ?Medications  ?albuterol (VENTOLIN HFA) 108 (90 Base) MCG/ACT inhaler 2 puff (has no administration in time range)  ?cefTRIAXone (ROCEPHIN) 1 g in sodium chloride 0.9 % 100 mL IVPB (has no administration in time range)  ?azithromycin (ZITHROMAX) 500 mg in sodium chloride 0.9 % 250 mL IVPB (has no administration in time range)  ?methylPREDNISolone sodium succinate (SOLU-MEDROL) 125 mg/2 mL injection 125 mg (has no administration in time range)  ?acetaminophen (TYLENOL) tablet 650 mg (650 mg Oral Given 01/13/22 1714)  ?sodium chloride  0.9 % bolus 1,000 mL (1,000 mLs Intravenous New Bag/Given 01/13/22 1713)  ? ? ?ED Course/ Medical Decision Making/ A&P ?  ?                        ?Medical Decision Making ?Nyesha Cliff is a 61 y.o. female here wit

## 2022-01-13 NOTE — Progress Notes (Signed)
Due to worsening respiratory status and concern for ability to protect her airway, decision was made to intubate the patient.  Called the patient's son to provide updates, but no answer.  Will call again. ? ?Patient will be admitted to ICU for vent management in the care of PCCM.  ?

## 2022-01-13 NOTE — ED Triage Notes (Signed)
BIBA ?Per EMS: Pt coming from home w/ c/o SHOB x a few days. Hx COPD.  ?Wears O2 at home as needed.  ?70s O2 on RA ?99% 2L  ?20G L wrist  ?200ccNS given en route  ?126HR  ?CBG 206  ?RR 20  ?142/68  ?
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
## 2022-01-13 NOTE — Progress Notes (Signed)
Pt transported from ED room RESB to ICU room 1232.  Pt remained stable and comfortable while on mechanical ventilation.  ?

## 2022-01-13 NOTE — Progress Notes (Addendum)
eLink Physician-Brief Progress Note ?Patient Name: Sandra Solis ?DOB: [birth year] ?MRN: [id number] ? ? ?Date of Service ? 01/13/2022  ?HPI/Events of Note ? ABG reviewed. Patient admitted with acute on chronic respiratory failure secondary to acute exacerbation of COPD, she failed BIPAP in the ED and was intubated.  ?eICU Interventions ? Ventilator changes ordered. New Patient Evaluation.  ? ? ? ?  ? ?Thomasene Lot Shaquil Aldana ?01/13/2022, 11:57 PM ?
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
## 2022-01-14 ENCOUNTER — Inpatient Hospital Stay (HOSPITAL_COMMUNITY): Payer: Medicaid Other

## 2022-01-14 DIAGNOSIS — J441 Chronic obstructive pulmonary disease with (acute) exacerbation: Secondary | ICD-10-CM | POA: Diagnosis not present

## 2022-01-14 DIAGNOSIS — R0609 Other forms of dyspnea: Secondary | ICD-10-CM

## 2022-01-14 LAB — ECHOCARDIOGRAM COMPLETE
Area-P 1/2: 1.7 cm2
Height: 63 in
MV VTI: 2.4 cm2
S' Lateral: 2.6 cm
Weight: 3654.34 oz

## 2022-01-14 LAB — RESPIRATORY PANEL BY PCR

## 2022-01-14 LAB — CBC WITH DIFFERENTIAL/PLATELET
Abs Immature Granulocytes: 0.03 10*3/uL (ref 0.00–0.07)
Basophils Absolute: 0 10*3/uL (ref 0.0–0.1)
Basophils Relative: 0 %
Eosinophils Absolute: 0 10*3/uL (ref 0.0–0.5)
Eosinophils Relative: 0 %
HCT: 50.5 % — ABNORMAL HIGH (ref 36.0–46.0)
Hemoglobin: 15.6 g/dL — ABNORMAL HIGH (ref 12.0–15.0)
Immature Granulocytes: 0 %
Lymphocytes Relative: 13 %
Lymphs Abs: 0.9 10*3/uL (ref 0.7–4.0)
MCH: 27.8 pg (ref 26.0–34.0)
MCHC: 30.9 g/dL (ref 30.0–36.0)
MCV: 90 fL (ref 80.0–100.0)
Monocytes Absolute: 0.2 10*3/uL (ref 0.1–1.0)
Monocytes Relative: 2 %
Neutro Abs: 6.1 10*3/uL (ref 1.7–7.7)
Neutrophils Relative %: 85 %
Platelets: 189 10*3/uL (ref 150–400)
RBC: 5.61 MIL/uL — ABNORMAL HIGH (ref 3.87–5.11)
RDW: 14.3 % (ref 11.5–15.5)
WBC: 7.2 10*3/uL (ref 4.0–10.5)
nRBC: 0 % (ref 0.0–0.2)

## 2022-01-14 LAB — BLOOD GAS, ARTERIAL
Acid-Base Excess: 4 mmol/L — ABNORMAL HIGH (ref 0.0–2.0)
Acid-Base Excess: 7.1 mmol/L — ABNORMAL HIGH (ref 0.0–2.0)
Bicarbonate: 31.6 mmol/L — ABNORMAL HIGH (ref 20.0–28.0)
Bicarbonate: 32.6 mmol/L — ABNORMAL HIGH (ref 20.0–28.0)
Drawn by: 42261
FIO2: 100 %
FIO2: 30 %
MECHVT: 420 mL
MECHVT: 420 mL
O2 Saturation: 99.4 %
O2 Saturation: 93.5 %
PEEP: 5 cmH2O
PEEP: 5 cmH2O
Patient temperature: 37.7
Patient temperature: 37.6
RATE: 20 resp/min
RATE: 22 resp/min
pCO2 arterial: 62 mmHg — ABNORMAL HIGH (ref 32–48)
pCO2 arterial: 49 mmHg — ABNORMAL HIGH (ref 32–48)
pH, Arterial: 7.32 — ABNORMAL LOW (ref 7.35–7.45)
pH, Arterial: 7.43 (ref 7.35–7.45)
pO2, Arterial: 440 mmHg — ABNORMAL HIGH (ref 83–108)
pO2, Arterial: 63 mmHg — ABNORMAL LOW (ref 83–108)

## 2022-01-14 LAB — COMPREHENSIVE METABOLIC PANEL
ALT: 71 U/L — ABNORMAL HIGH (ref 0–44)
AST: 95 U/L — ABNORMAL HIGH (ref 15–41)
Albumin: 3.2 g/dL — ABNORMAL LOW (ref 3.5–5.0)
Alkaline Phosphatase: 96 U/L (ref 38–126)
Anion gap: 9 (ref 5–15)
BUN: 13 mg/dL (ref 8–23)
CO2: 27 mmol/L (ref 22–32)
Calcium: 8.8 mg/dL — ABNORMAL LOW (ref 8.9–10.3)
Chloride: 97 mmol/L — ABNORMAL LOW (ref 98–111)
Creatinine, Ser: 0.82 mg/dL (ref 0.44–1.00)
GFR, Estimated: >60 mL/min (ref >60)
Glucose, Bld: 255 mg/dL — ABNORMAL HIGH (ref 70–99)
Potassium: 5.1 mmol/L (ref 3.5–5.1)
Sodium: 133 mmol/L — ABNORMAL LOW (ref 135–145)
Total Bilirubin: 0.8 mg/dL (ref 0.3–1.2)
Total Protein: 6.4 g/dL — ABNORMAL LOW (ref 6.5–8.1)

## 2022-01-14 LAB — GLUCOSE, CAPILLARY
Glucose-Capillary: 147 mg/dL — ABNORMAL HIGH (ref 70–99)
Glucose-Capillary: 255 mg/dL — ABNORMAL HIGH (ref 70–99)
Glucose-Capillary: 246 mg/dL — ABNORMAL HIGH (ref 70–99)
Glucose-Capillary: 203 mg/dL — ABNORMAL HIGH (ref 70–99)
Glucose-Capillary: 191 mg/dL — ABNORMAL HIGH (ref 70–99)
Glucose-Capillary: 189 mg/dL — ABNORMAL HIGH (ref 70–99)
Glucose-Capillary: 190 mg/dL — ABNORMAL HIGH (ref 70–99)

## 2022-01-14 LAB — HEMOGLOBIN A1C
Hgb A1c MFr Bld: 7.7 % — ABNORMAL HIGH (ref 4.8–5.6)
Mean Plasma Glucose: 174.29 mg/dL

## 2022-01-14 LAB — MRSA NEXT GEN BY PCR, NASAL: MRSA by PCR Next Gen: NOT DETECTED

## 2022-01-14 LAB — MAGNESIUM: Magnesium: 2.2 mg/dL (ref 1.7–2.4)

## 2022-01-14 LAB — BRAIN NATRIURETIC PEPTIDE: B Natriuretic Peptide: 383.1 pg/mL — ABNORMAL HIGH (ref 0.0–100.0)

## 2022-01-14 LAB — PHOSPHORUS
Phosphorus: 2.9 mg/dL (ref 2.5–4.6)
Phosphorus: 3 mg/dL (ref 2.5–4.6)

## 2022-01-14 LAB — HIV ANTIBODY (ROUTINE TESTING W REFLEX): HIV Screen 4th Generation wRfx: NONREACTIVE

## 2022-01-14 LAB — TSH: TSH: 0.303 u[IU]/mL — ABNORMAL LOW (ref 0.350–4.500)

## 2022-01-14 LAB — T4, FREE: Free T4: 0.91 ng/dL (ref 0.61–1.12)

## 2022-01-14 MED ORDER — PERFLUTREN LIPID MICROSPHERE
1.0000 mL | INTRAVENOUS | Status: AC | PRN
  Administered 2022-01-14: 4 mL via INTRAVENOUS
  Filled 2022-01-14: qty 10

## 2022-01-14 MED ORDER — SERTRALINE HCL 20 MG/ML PO CONC: 100.0000 mg | Freq: Every day | ORAL | Status: DC

## 2022-01-14 MED ORDER — CHLORHEXIDINE GLUCONATE 0.12 % MT SOLN
OROMUCOSAL | Status: AC
  Filled 2022-01-14: qty 15

## 2022-01-14 MED ORDER — ACETAMINOPHEN 160 MG/5ML PO SOLN
650.0000 mg | Freq: Four times a day (QID) | ORAL | Status: DC | PRN
  Administered 2022-01-14: 650 mg
  Filled 2022-01-14: qty 20.3

## 2022-01-14 MED ORDER — PROSOURCE TF PO LIQD
45.0000 mL | Freq: Two times a day (BID) | ORAL | Status: DC
  Administered 2022-01-14: 45 mL
  Filled 2022-01-14: qty 45

## 2022-01-14 MED ORDER — PREDNISONE 20 MG PO TABS
40.0000 mg | ORAL_TABLET | Freq: Every day | ORAL | Status: DC
  Administered 2022-01-14 – 2022-01-15 (×2): 40 mg
  Filled 2022-01-14 (×2): qty 2

## 2022-01-14 MED ORDER — VITAL AF 1.2 CAL PO LIQD
1000.0000 mL | ORAL | Status: DC
Start: 2022-01-14 — End: 2022-01-15
  Administered 2022-01-14: 1000 mL

## 2022-01-14 NOTE — Plan of Care (Signed)
Discussed in front of patient plan of care for the evening, pain management and ventilator medications with no signs of teach back at this time.  Was able to keep on relatively low amounts of sedation without pressers d/t low blood pressures at times.  Patient safety mitts weren't effective and obtained an order for restraints.  Daughter came to room and staying overnight informed of plan and took purse/phone and medications home. ? ?Problem: Pain Managment: ?Goal: General experience of comfort will improve ?Outcome: Not Progressing ?  ?Problem: Respiratory: ?Goal: Ability to maintain a clear airway will improve ?Outcome: Progressing ?  ?

## 2022-01-14 NOTE — Progress Notes (Signed)
eLink Physician-Brief Progress Note ?Patient Name: Sandra Solis ?DOB: 07-Sep-1961 ?MRN: [id number] ? ? ?Date of Service ? 01/14/2022  ?HPI/Events of Note ? Patient needs an ABG .  ?eICU Interventions ? ABG ordered.  ? ? ? ?  ? ?Thomasene Lot Nansi [place] ?01/14/2022, 6:51 AM ?

## 2022-01-14 NOTE — Progress Notes (Signed)
Inpatient Diabetes Program Recommendations ? ?AACE/ADA: New Consensus Statement on Inpatient Glycemic Control (2015) ? ?Target Ranges:  Prepandial:   less than 140 mg/dL ?     Peak postprandial:   less than 180 mg/dL (1-2 hours) ?     Critically ill patients:  140 - 180 mg/dL  ? ?Lab Results  ?Component Value Date  ? GLUCAP 246 (H) 01/14/2022  ? HGBA1C 7.7 (H) 01/14/2022  ? ? ?Review of Glycemic Control ? Latest Reference Range & Units 01/13/22 22:34 01/13/22 23:24 01/14/22 03:04 01/14/22 03:09 01/14/22 07:20  ?Glucose-Capillary 70 - 99 mg/dL 196 (H) 222 (H) 979 (H) 255 (H) 246 (H)  ?(H): Data is abnormally high ? ?Diabetes history: DM2 ?Outpatient Diabetes medications: Glipizide 10 mg QAM, Metformin 1000 mg BID ?Current orders for Inpatient glycemic control: Novolog 0-20 units Q4H, Prednisone 40 mg QAM ? ?Inpatient Diabetes Program Recommendations:   ? ?Please consider Semglee 10 units QD (0.1 units/kg) ? ?Will continue to follow while inpatient. ? ?Thank you, ?Dulce Sellar, MSN, RN, CDCES ?Diabetes Coordinator ?Inpatient Diabetes Program ?[phone] (team pager from 8a-5p) ? ? ? ?

## 2022-01-14 NOTE — Progress Notes (Signed)
eLink Physician-Brief Progress Note ?Patient Name: Sandra Solis ?DOB: 31-Aug-1961 ?MRN: [id number] ? ? ?Date of Service ? 01/14/2022  ?HPI/Events of Note ? Patient is on the ventilator and requires bilateral wrist restraints to prevent self-extubation.  ?eICU Interventions ? Bilateral wrist restraints ordered.  ? ? ? ?  ? ?Kerry Kass Deasia Chiu ?01/14/2022, 1:08 AM ?

## 2022-01-14 NOTE — Progress Notes (Signed)
Initial Nutrition Assessment ? ?DOCUMENTATION CODES:  ? ?Obesity unspecified ? ?INTERVENTION:  ?- recommend Vital AF 1.2 @ 30 ml/hr to advance by 10 ml every 4 hours to reach goal rate of 50 ml/hr with 45 ml Prosource TF BID. ? ?- at goal rate, this regimen will provide 1520 kcal, 112 grams protein, and 973 ml free water. ? ? ?NUTRITION DIAGNOSIS:  ? ?Inadequate oral intake related to inability to eat as evidenced by NPO status. ? ?GOAL:  ? ?Provide needs based on ASPEN/SCCM guidelines ? ?MONITOR:  ? ?Vent status, TF tolerance, Labs, Weight trends ? ?REASON FOR ASSESSMENT:  ? ?Ventilator, Consult ?Enteral/tube feeding initiation and management ? ?ASSESSMENT:  ? ?61 y.o. female with medical history of ongoing tobacco abuse, COPD, asthma, on home O2, HTN, hypercholesterolemia, asthma, CHF, and DM. She presented to the ED due to worsening dyspnea and cough. She was started on steroids and abx for COPD exacerbation. She was placed on BiPAP but became agitated and was given Ativan. She then became obtunded with worsening respiratory acidosis and was intubated in the ED before being transferred to the ICU. ? ?Patient discussed in rounds this AM. She was intubated late last night and remains intubated at this time. OGT in place (gastric per abdominal x-ray) and she was started on trickle feeds of Vital High Protein @ 20 ml/hr. This regimen is providing 480 kcal, 42 grams protein, and 401 ml free water.  ? ?Patient's son is at bedside and provides information. He shares that patient has had a chronic cough productive of sputum for as long as he can remember. She carries around a bag  that she spits into. Patient enjoys sweets/sugary foods. She did not have an appetite the day PTA or the morning of presentation to the ED and when her last meal was is unknown. ? ?Per chart review, weight today is 228 lb and weight has been stable since 09/18/20. ? ? ? ?Patient is currently intubated on ventilator support ?MV: 9.3 L/min ?Temp  (24hrs), Avg:99.7 ?F (37.6 ?C), Min:93.6 ?F (34.2 ?C), Max:100.8 ?F (38.2 ?C) ?Propofol: none ?BP: 136/77 and MAP: 97 ? ?Labs reviewed; CBGs: 147, 255, 246, 203 mg/dl, Na: 573 mmol/l, Cl: 97 mmol/l, Ca: 8.8 mg/dl, LFTs elevated.  ? ?Medications reviewed; 100 mg colace BID, sliding scale novolog, 40 mg protonix per OGT/day, 17 g miralax/day, 40 mg deltasone/day. ? ?Drip; precedex @ 0.4 mcg/kg/min. ? ?IVF; NS @ 40 ml/hr. ?  ? ?NUTRITION - FOCUSED PHYSICAL EXAM: ? ?Flowsheet Row Most Recent Value  ?Orbital Region Unable to assess  [ETT holder]  ?Upper Arm Region No depletion  ?Thoracic and Lumbar Region Unable to assess  ?Buccal Region Unable to assess  [ETT holder]  ?Temple Region No depletion  ?Clavicle Bone Region No depletion  ?Clavicle and Acromion Bone Region No depletion  ?Scapular Bone Region No depletion  ?Dorsal Hand No depletion  ?Patellar Region No depletion  ?Anterior Thigh Region No depletion  ?Posterior Calf Region No depletion  ?Edema (RD Assessment) Mild  [BLE]  ?Hair Reviewed  ?Eyes Unable to assess  ?Mouth Unable to assess  ?Skin Reviewed  ?Nails Reviewed  ? ?  ? ? ?Diet Order:   ?Diet Order   ? ?       ?  Diet NPO time specified  Diet effective now       ?  ? ?  ?  ? ?  ? ? ?EDUCATION NEEDS:  ? ?Not appropriate for education at this time ? ?  Skin:  Skin Assessment: Reviewed RN Assessment ? ?Last BM:  PTA/unknown ? ?Height:  ? ?Ht Readings from Last 1 Encounters:  ?01/14/22 5\' 3"  (1.6 m)  ? ? ?Weight:  ? ?Wt Readings from Last 1 Encounters:  ?01/14/22 103.6 kg  ? ? ? ?BMI:  Body mass index is 40.46 kg/m?. ? ?Estimated Nutritional Needs:  ?Kcal:  1475-1770 kcal ?Protein:  >/= 103 grams ?Fluid:  >/= 1.6 L/day ? ? ? ? ?03/16/22, MS, RD, LDN ?Registered Dietitian II ?Inpatient Clinical Nutrition ?RD pager # and on-call/weekend pager # available in AMION  ? ?

## 2022-01-14 NOTE — Progress Notes (Signed)
? ?NAME:  Sandra Solis, MRN:  [id number], DOB:  22-Mar-1961, LOS: 1 ?ADMISSION DATE:  01/13/2022, CONSULTATION DATE:  01/13/2022 ?REFERRING MD:  Dr. Margo Aye, [facility], CHIEF COMPLAINT:  Short of breath  ? ?History of Present Illness:  ?61 yo female smoker with reported hx of COPD and asthma with chronic respiratory failure on home oxygen presented to [facility] with worsening dyspnea and cough for few days.  SpO2 on room air in ER was 70s.  She was found to have acute on chronic hypoxia and hypercapnia based on ABG.  She was started on solumedrol and antibiotics for COPD exacerbation.  She was started on Bipap, but became more agitated.  She was given ativan.  She became more obtunded with worsening respiratory acidosis.  She required intubation in the ER. ? ?Hx from chart and medical team. ? ?Pertinent  Medical History  ?Diastolic CHF, Asthma, COPD, Chronic respiratory failure on home oxygen at 2 liters, DM type 2, HLD, HTN ? ?Significant Hospital Events: ?Including procedures, antibiotic start and stop dates in addition to other pertinent events   ?5/08 Failed Bipap in ER and intubated, admit to ICU, started solumedrol and antibiotics ? ?Interim History / Subjective:  ?Failed SBT due to agitation with associated hypoxemia ? ?Objective   ?Blood pressure 111/64, pulse 68, temperature 99.7 ?F (37.6 ?C), resp. rate (!) 22, height 5\' 3"  (1.6 m), weight 103.6 kg, SpO2 99 %. ?   ?Vent Mode: PRVC ?FiO2 (%):  [30 %-100 %] 30 % ?Set Rate:  [20 bmp-22 bmp] 22 bmp ?Vt Set:  [420 mL] 420 mL ?PEEP:  [5 cmH20] 5 cmH20 ?Plateau Pressure:  [24 cmH20] 24 cmH20  ? ?Intake/Output Summary (Last 24 hours) at 01/14/2022 0800 ?Last data filed at 01/14/2022 03/16/2022 ?Gross per 24 hour  ?Intake 1815.48 ml  ?Output 450 ml  ?Net 1365.48 ml  ? ?Filed Weights  ? 01/13/22 2003 01/14/22 0348  ?Weight: 104.3 kg 103.6 kg  ? ?Physical Exam: ?General: Well-appearing, no acute distress ?HENT: [place], AT, ETT in place ?Eyes: EOMI, no scleral icterus ?Respiratory: Clear to  auscultation bilaterally.  No crackles, wheezing or rales ?Cardiovascular: RRR, -M/R/G, no JVD ?GI: BS+, soft, nontender ?Extremities:-Edema,-tenderness ?Neuro: Opens eyes to voice, follows simple commands, moves extremities x 4 ? ?CXR 01/13/22 - ETT in place, no infiltrate ?BNP 383 ? ?Resolved Hospital Problem list   ? ? ?Assessment & Plan:  ? ?Acute on chronic hypoxic/hypercapnic respiratory failure from COPD/asthma exacerbation in addition to probably sleep disordered breathing. ?- ETT 5/8 ?- Full vent support ?- SBT/WUA daily. Failed today but retrial in PM if RASS -1 ?- ABG reviewed with compensated hypercarbic respiratory failure and adequate oxygenation ?- Continue steroids ?- De-escalate antibiotics to azithromycin ?- Scheduled duoneb with prn albuterol ?- F/u RVP, cultures, urine strep/legionella ?- PAD protocol for goal RASS -1 ? ?Chronic diastolic CHF, HTN, HLD. ?- f/u Echo ?- continue norvasc, pravachol ?- hold outpt lisinopril-HCTZ ? ?DM type 2 poorly controlled with hyperglycemia. ?- SSI ?- hold outpt metformin, glucotrol ? ?Acute metabolic encephalopathy 2nd to hypoxia, hypercapnia. ?Hx of depression, chronic pain. ?- precedex with prn versed/fentanyl for RASS goal 0 to -1 ?- hold outpt seroquel, lidoderm patch, skelaxin ?- restart zoloft ? ?Erythrocytosis. ?- likely from chronic hypoxia ?- Trend ? ?Borderline hypothyroidism ?-Check T3, T4 ?-Will need recheck as outpatient ? ? ?Best Practice (right click and "Reselect all SmartList Selections" daily)  ? ?Diet/type: tubefeeds ?DVT prophylaxis: LMWH ?GI prophylaxis: PPI ?Lines: N/A ?Foley:  N/A ?Code Status:  full code ?Last date of multidisciplinary goals of care discussion [x]  ? - Updated daughters at bedside ? ? ?Critical care time: 45 minutes  ? ?The patient is critically ill with multiple organ systems failure and requires high complexity decision making for assessment and support, frequent evaluation and titration of therapies, application of  advanced monitoring technologies and extensive interpretation of multiple databases.  Independent Critical Care Time: 45 Minutes.  ? ? , M.D. ?[facility] ?01/14/2022 8:00 AM  ? ?Please see Amion for pager number to reach on-call Pulmonary and Critical Care Team. ? ? ? ?

## 2022-01-15 DIAGNOSIS — J441 Chronic obstructive pulmonary disease with (acute) exacerbation: Secondary | ICD-10-CM | POA: Diagnosis not present

## 2022-01-15 LAB — BASIC METABOLIC PANEL WITH GFR
Anion gap: 8 (ref 5–15)
BUN: 22 mg/dL (ref 8–23)
CO2: 29 mmol/L (ref 22–32)
Calcium: 9.3 mg/dL (ref 8.9–10.3)
Chloride: 98 mmol/L (ref 98–111)
Creatinine, Ser: 0.78 mg/dL (ref 0.44–1.00)
GFR, Estimated: >60 mL/min
Glucose, Bld: 196 mg/dL — ABNORMAL HIGH (ref 70–99)
Potassium: 4.3 mmol/L (ref 3.5–5.1)
Sodium: 135 mmol/L (ref 135–145)

## 2022-01-15 LAB — CBC
HCT: 46.7 % — ABNORMAL HIGH (ref 36.0–46.0)
Hemoglobin: 14.8 g/dL (ref 12.0–15.0)
MCH: 27.6 pg (ref 26.0–34.0)
MCHC: 31.7 g/dL (ref 30.0–36.0)
MCV: 87.1 fL (ref 80.0–100.0)
Platelets: 206 10*3/uL (ref 150–400)
RBC: 5.36 MIL/uL — ABNORMAL HIGH (ref 3.87–5.11)
RDW: 14.2 % (ref 11.5–15.5)
WBC: 8 10*3/uL (ref 4.0–10.5)
nRBC: 0 % (ref 0.0–0.2)

## 2022-01-15 LAB — GLUCOSE, CAPILLARY
Glucose-Capillary: 169 mg/dL — ABNORMAL HIGH (ref 70–99)
Glucose-Capillary: 244 mg/dL — ABNORMAL HIGH (ref 70–99)
Glucose-Capillary: 141 mg/dL — ABNORMAL HIGH (ref 70–99)
Glucose-Capillary: 218 mg/dL — ABNORMAL HIGH (ref 70–99)
Glucose-Capillary: 145 mg/dL — ABNORMAL HIGH (ref 70–99)

## 2022-01-15 LAB — T3: T3, Total: 65 ng/dL — ABNORMAL LOW (ref 71–180)

## 2022-01-15 MED ORDER — HYDROCHLOROTHIAZIDE 25 MG PO TABS: 25.0000 mg | ORAL_TABLET | Freq: Every day | ORAL | Status: DC

## 2022-01-15 MED ORDER — PRAVASTATIN SODIUM 40 MG PO TABS
40.0000 mg | ORAL_TABLET | Freq: Every day | ORAL | Status: DC
  Administered 2022-01-15 – 2022-01-17 (×3): 40 mg via ORAL
  Filled 2022-01-15: qty 2
  Filled 2022-01-15: qty 1
  Filled 2022-01-15 (×2): qty 2

## 2022-01-15 MED ORDER — AMLODIPINE BESYLATE 10 MG PO TABS
10.0000 mg | ORAL_TABLET | Freq: Every day | ORAL | Status: DC
  Administered 2022-01-15 – 2022-01-17 (×3): 10 mg via ORAL
  Filled 2022-01-15 (×3): qty 1

## 2022-01-15 MED ORDER — ACETAMINOPHEN 325 MG PO TABS
650.0000 mg | ORAL_TABLET | Freq: Four times a day (QID) | ORAL | Status: DC | PRN
  Administered 2022-01-15: 650 mg via ORAL
  Filled 2022-01-15: qty 2

## 2022-01-15 MED ORDER — HYDROCHLOROTHIAZIDE 25 MG PO TABS
25.0000 mg | ORAL_TABLET | Freq: Every day | ORAL | Status: DC
  Administered 2022-01-15 – 2022-01-17 (×3): 25 mg via ORAL
  Filled 2022-01-15 (×3): qty 1

## 2022-01-15 MED ORDER — LISINOPRIL 10 MG PO TABS: 20.0000 mg | ORAL_TABLET | Freq: Every day | ORAL | Status: DC

## 2022-01-15 MED ORDER — DOCUSATE SODIUM 50 MG/5ML PO LIQD
100.0000 mg | Freq: Two times a day (BID) | ORAL | Status: DC
  Administered 2022-01-15 – 2022-01-16 (×2): 100 mg via ORAL
  Filled 2022-01-15 (×3): qty 10

## 2022-01-15 MED ORDER — PREDNISONE 20 MG PO TABS
40.0000 mg | ORAL_TABLET | Freq: Every day | ORAL | Status: AC
  Administered 2022-01-16 – 2022-01-17 (×2): 40 mg via ORAL
  Filled 2022-01-15 (×2): qty 2

## 2022-01-15 MED ORDER — PHENOL 1.4 % MT LIQD
1.0000 | OROMUCOSAL | Status: DC | PRN
  Administered 2022-01-15: 1 via OROMUCOSAL
  Filled 2022-01-15: qty 177

## 2022-01-15 MED ORDER — LISINOPRIL 20 MG PO TABS
20.0000 mg | ORAL_TABLET | Freq: Every day | ORAL | Status: DC
  Administered 2022-01-15 – 2022-01-17 (×3): 20 mg via ORAL
  Filled 2022-01-15: qty 2
  Filled 2022-01-15: qty 1
  Filled 2022-01-15: qty 2

## 2022-01-15 MED ORDER — ACETAMINOPHEN 160 MG/5ML PO SOLN: 650.0000 mg | Freq: Four times a day (QID) | ORAL | Status: DC | PRN

## 2022-01-15 MED ORDER — POLYETHYLENE GLYCOL 3350 17 G PO PACK
17.0000 g | PACK | Freq: Every day | ORAL | Status: DC
Start: 2022-01-15 — End: 2022-01-18
  Administered 2022-01-16 – 2022-01-17 (×2): 17 g via ORAL
  Filled 2022-01-15 (×3): qty 1

## 2022-01-15 MED ORDER — INSULIN ASPART 100 UNIT/ML IJ SOLN
0.0000 [IU] | Freq: Three times a day (TID) | INTRAMUSCULAR | Status: DC
  Administered 2022-01-15: 7 [IU] via SUBCUTANEOUS
  Administered 2022-01-15: 3 [IU] via SUBCUTANEOUS
  Administered 2022-01-16: 7 [IU] via SUBCUTANEOUS
  Administered 2022-01-16: 3 [IU] via SUBCUTANEOUS
  Administered 2022-01-16: 15 [IU] via SUBCUTANEOUS
  Administered 2022-01-17: 11 [IU] via SUBCUTANEOUS
  Administered 2022-01-17: 4 [IU] via SUBCUTANEOUS

## 2022-01-15 MED ORDER — PANTOPRAZOLE SODIUM 40 MG PO TBEC
40.0000 mg | DELAYED_RELEASE_TABLET | Freq: Every day | ORAL | Status: DC
Start: 2022-01-15 — End: 2022-01-18
  Administered 2022-01-15 – 2022-01-17 (×3): 40 mg via ORAL
  Filled 2022-01-15 (×3): qty 1

## 2022-01-15 MED ORDER — ADULT MULTIVITAMIN W/MINERALS CH
1.0000 | ORAL_TABLET | Freq: Every day | ORAL | Status: DC
  Administered 2022-01-15 – 2022-01-17 (×3): 1 via ORAL
  Filled 2022-01-15 (×3): qty 1

## 2022-01-15 MED ORDER — IPRATROPIUM-ALBUTEROL 0.5-2.5 (3) MG/3ML IN SOLN: 3.0000 mL | Freq: Four times a day (QID) | RESPIRATORY_TRACT | Status: DC | PRN

## 2022-01-15 MED ORDER — LISINOPRIL-HYDROCHLOROTHIAZIDE 20-25 MG PO TABS: 1.0000 | ORAL_TABLET | Freq: Every day | ORAL | Status: DC

## 2022-01-15 NOTE — Progress Notes (Signed)
Nutrition Follow-up ? ?DOCUMENTATION CODES:  ? ?Obesity unspecified ? ?INTERVENTION:  ?- will order 1 tablet multivitamin with minerals/day. ? ?- will monitor for additional needs. ? ? ?NUTRITION DIAGNOSIS:  ? ?Increased nutrient needs related to acute illness as evidenced by estimated needs. -revised, ongoing ? ?GOAL:  ? ?Patient will meet greater than or equal to 90% of their needs -unable to meet with recent diet advancement ? ?MONITOR:  ? ?PO intake, Labs, Weight trends ? ?ASSESSMENT:  ? ?61 y.o. female with medical history of ongoing tobacco abuse, COPD, asthma, on home O2, HTN, hypercholesterolemia, asthma, CHF, and DM. She presented to the ED due to worsening dyspnea and cough. She was started on steroids and abx for COPD exacerbation. She was placed on BiPAP but became agitated and was given Ativan. She then became obtunded with worsening respiratory acidosis and was intubated in the ED before being transferred to the ICU. ? ?Patient was extubated and OGT removed this AM. Re-estimated nutrition needs. Patient discussed in rounds this AM and RN reported that patient was swallowing well and had ordered a lunch tray.  ? ?Weight has been stable since 5/8. Non-pitting edema to BLE documented in the edema section of flow sheet.  ? ? ?Labs reviewed; CBGs: 169 and 244 mg/dl. ? ?Medications reviewed; 100 mg colace BID, sliding scale novolog, 25 mg hydrodiuril/day, 40 mg oral protonix/day, 17 g miralax/day, 40 mg deltasone/day.  ? ?IVF; NS @ 40 ml/hr. ? ? ?Diet Order:   ?Diet Order   ? ?       ?  Diet regular Room service appropriate? Yes; Fluid consistency: Thin  Diet effective now       ?  ? ?  ?  ? ?  ? ? ?EDUCATION NEEDS:  ? ?No education needs have been identified at this time ? ?Skin:  Skin Assessment: Reviewed RN Assessment ? ?Last BM:  PTA/unknown ? ?Height:  ? ?Ht Readings from Last 1 Encounters:  ?01/14/22 5\' 3"  (1.6 m)  ? ? ?Weight:  ? ?Wt Readings from Last 1 Encounters:  ?01/15/22 104.9 kg  ? ? ?BMI:   Body mass index is 40.97 kg/m?. ? ?Estimated Nutritional Needs:  ?Kcal:  2100-2300 kcal ?Protein:  90-100 grams ?Fluid:  >/= 1.6 L/day ? ? ? ? ?03/17/22, MS, RD, LDN ?Registered Dietitian II ?Inpatient Clinical Nutrition ?RD pager # and on-call/weekend pager # available in AMION  ? ?

## 2022-01-15 NOTE — Progress Notes (Addendum)
? ?NAME:  Sandra Solis, MRN:  [id number], DOB:  Jan 22, 1961, LOS: 2 ?ADMISSION DATE:  01/13/2022, CONSULTATION DATE:  01/13/2022 ?REFERRING MD:  Dr. Margo Aye, [facility], CHIEF COMPLAINT:  Short of breath  ? ?History of Present Illness:  ?61 yo female smoker with reported hx of COPD and asthma with chronic respiratory failure on home oxygen presented to [facility] with worsening dyspnea and cough for few days.  SpO2 on room air in ER was 70s.  She was found to have acute on chronic hypoxia and hypercapnia based on ABG.  She was started on solumedrol and antibiotics for COPD exacerbation.  She was started on Bipap, but became more agitated.  She was given ativan.  She became more obtunded with worsening respiratory acidosis.  She required intubation in the ER. ? ?Hx from chart and medical team. ? ?Pertinent  Medical History  ?Diastolic CHF, Asthma, COPD, Chronic respiratory failure on home oxygen at 2 liters, DM type 2, HLD, HTN ? ?Significant Hospital Events: ?Including procedures, antibiotic start and stop dates in addition to other pertinent events   ?5/08 Failed Bipap in ER and intubated, admit to ICU, started solumedrol and antibiotics ?5/9 Failed SBT due to agitation ? ?Interim History / Subjective:  ?Passed SBT. Updated patient and daughter at bedside ?Objective   ?Blood pressure (!) 160/113, pulse 77, temperature 98.1 ?F (36.7 ?C), resp. rate 16, height 5\' 3"  (1.6 m), weight 104.9 kg, SpO2 99 %. ?   ?Vent Mode: PSV ?FiO2 (%):  [30 %] 30 % ?Set Rate:  [22 bmp] 22 bmp ?Vt Set:  [420 mL] 420 mL ?PEEP:  [5 cmH20] 5 cmH20 ?Pressure Support:  [8 cmH20] 8 cmH20 ?Plateau Pressure:  [19 cmH20-25 cmH20] 22 cmH20  ? ?Intake/Output Summary (Last 24 hours) at 01/15/2022 0825 ?Last data filed at 01/15/2022 0600 ?Gross per 24 hour  ?Intake 2044.61 ml  ?Output 850 ml  ?Net 1194.61 ml  ? ?Filed Weights  ? 01/13/22 2003 01/14/22 0348 01/15/22 0428  ?Weight: 104.3 kg 103.6 kg 104.9 kg  ? ?Physical Exam: ?General: Well-appearing, no acute  distress ?HENT: [place], AT, ETT in place ?Eyes: EOMI, no scleral icterus ?Respiratory: Clear to auscultation bilaterally.  No crackles, wheezing or rales ?Cardiovascular: RRR, -M/R/G, no JVD ?GI: BS+, soft, nontender ?Extremities:-Edema,-tenderness ?Neuro: Awake, follows commands, CNII-XII grossly intact ? ? ?CXR 01/13/22 - ETT in place, no infiltrate ?BNP 383 ?5/10 CBC and BMET reviewed - overall normal with glucose 196 ? ?Resolved Hospital Problem list   ? ? ?Assessment & Plan:  ? ?Acute on chronic hypoxic/hypercapnic respiratory failure from COPD/asthma exacerbation secondary to adenovirus ?Probable sleep disordered breathing. ?- ETT 5/8>5/10 ?- Extubate today ?- BiPAP nightly and PRN while inpatient ?- Continue steroids and azithromycin ?- PRN Duoneb, albuterol ?- F/u urine strep/legionella ?- Prior to discharge recommend starting Symbicort (160) and Spiriva ?- Will provide information for hospital follow-up with Pulmonary for OSA evaluation and COPD management ? ?Chronic diastolic CHF, HTN, HLD. ?- Overall normal echo. EF 55-60% ?- continue norvasc, pravachol ?- hold outpt lisinopril-HCTZ ? ?DM type 2 poorly controlled with hyperglycemia. ?- SSI ?- hold outpt metformin, glucotrol ? ?Acute metabolic encephalopathy 2nd to hypoxia, hypercapnia. ?Hx of depression, chronic pain. - resolved ?- DC precedex post-extubation ?- hold outpt seroquel, lidoderm patch, skelaxin ?- Continue zoloft ? ?Erythrocytosis. ?- likely from chronic hypoxia ?- Trend ? ?Subclinical hypothyroidism ?-Normal T4, T3 pending ?-Will need recheck as outpatient ? ?Transfer for [facility] to pick-up 5/11 ? ?Best Practice (right click and "Reselect all  SmartList Selections" daily)  ? ?Diet/type: NPO ?DVT prophylaxis: LMWH ?GI prophylaxis: PPI ?Lines: N/A ?Foley:  N/A ?Code Status:  full code ?Last date of multidisciplinary goals of care discussion [x]  ? -Updated patient and daughter at bedside 5/10 ? ? ?Critical care time: 45 minutes  ? ? ?The patient is  critically ill with multiple organ systems failure and requires high complexity decision making for assessment and support, frequent evaluation and titration of therapies, application of advanced monitoring technologies and extensive interpretation of multiple databases.  Independent Critical Care Time: 45 Minutes.  ? ? , M.D. ?[facility] ?01/15/2022 8:26 AM  ? ?Please see Amion for pager number to reach on-call Pulmonary and Critical Care Team. ? ? ? ?

## 2022-01-15 NOTE — Procedures (Signed)
Extubation Procedure Note ? ?Patient Details:   ?Name: Sandra Solis ?DOB: 08-28-1961 ?MRN: [id number] ?  ?Airway Documentation:  ?  ?Vent end date: 01/15/22 Vent end time: 0900  ? ?Evaluation ? O2 sats: stable throughout ?Complications: No apparent complications ?Patient did tolerate procedure well. ?Bilateral Breath Sounds: Diminished ?  ?Yes ? ?Renold Genta ?01/15/2022, 9:02 AM ? ?

## 2022-01-15 NOTE — Plan of Care (Signed)
?  Problem: Pain Managment: ?Goal: General experience of comfort will improve ?Outcome: Progressing ?  ?Problem: Respiratory: ?Goal: Levels of oxygenation will improve ?Outcome: Progressing ?Goal: Ability to maintain adequate ventilation will improve ?Outcome: Progressing ?  ?

## 2022-01-15 NOTE — Progress Notes (Addendum)
Attempted nocturnal bipap but pt unable to tolerate it.  Pt kept it on for maybe five minutes then wanted it taken off.  Pt stated she will not be able to keep it on all night.  Pt placed back on 2lnc.  RN aware. ?

## 2022-01-16 DIAGNOSIS — E119 Type 2 diabetes mellitus without complications: Secondary | ICD-10-CM

## 2022-01-16 DIAGNOSIS — I5032 Chronic diastolic (congestive) heart failure: Secondary | ICD-10-CM

## 2022-01-16 DIAGNOSIS — J9621 Acute and chronic respiratory failure with hypoxia: Secondary | ICD-10-CM

## 2022-01-16 DIAGNOSIS — B340 Adenovirus infection, unspecified: Secondary | ICD-10-CM

## 2022-01-16 DIAGNOSIS — I10 Essential (primary) hypertension: Secondary | ICD-10-CM

## 2022-01-16 DIAGNOSIS — M545 Low back pain, unspecified: Secondary | ICD-10-CM

## 2022-01-16 DIAGNOSIS — G9340 Encephalopathy, unspecified: Secondary | ICD-10-CM

## 2022-01-16 DIAGNOSIS — F17200 Nicotine dependence, unspecified, uncomplicated: Secondary | ICD-10-CM

## 2022-01-16 DIAGNOSIS — G8929 Other chronic pain: Secondary | ICD-10-CM

## 2022-01-16 DIAGNOSIS — J441 Chronic obstructive pulmonary disease with (acute) exacerbation: Secondary | ICD-10-CM | POA: Diagnosis not present

## 2022-01-16 LAB — CBC
HCT: 49.1 % — ABNORMAL HIGH (ref 36.0–46.0)
Hemoglobin: 15.3 g/dL — ABNORMAL HIGH (ref 12.0–15.0)
MCH: 27.9 pg (ref 26.0–34.0)
MCHC: 31.2 g/dL (ref 30.0–36.0)
MCV: 89.4 fL (ref 80.0–100.0)
Platelets: 232 10*3/uL (ref 150–400)
RBC: 5.49 MIL/uL — ABNORMAL HIGH (ref 3.87–5.11)
RDW: 14.1 % (ref 11.5–15.5)
WBC: 9.8 10*3/uL (ref 4.0–10.5)
nRBC: 0 % (ref 0.0–0.2)

## 2022-01-16 LAB — BASIC METABOLIC PANEL
Anion gap: 8 (ref 5–15)
BUN: 16 mg/dL (ref 8–23)
CO2: 30 mmol/L (ref 22–32)
Calcium: 9.4 mg/dL (ref 8.9–10.3)
Chloride: 95 mmol/L — ABNORMAL LOW (ref 98–111)
Creatinine, Ser: 0.68 mg/dL (ref 0.44–1.00)
GFR, Estimated: >60 mL/min (ref >60)
Glucose, Bld: 141 mg/dL — ABNORMAL HIGH (ref 70–99)
Potassium: 4 mmol/L (ref 3.5–5.1)
Sodium: 133 mmol/L — ABNORMAL LOW (ref 135–145)

## 2022-01-16 LAB — GLUCOSE, CAPILLARY
Glucose-Capillary: 123 mg/dL — ABNORMAL HIGH (ref 70–99)
Glucose-Capillary: 220 mg/dL — ABNORMAL HIGH (ref 70–99)
Glucose-Capillary: 321 mg/dL — ABNORMAL HIGH (ref 70–99)
Glucose-Capillary: 261 mg/dL — ABNORMAL HIGH (ref 70–99)

## 2022-01-16 MED ORDER — UMECLIDINIUM BROMIDE 62.5 MCG/ACT IN AEPB
1.0000 | INHALATION_SPRAY | Freq: Every day | RESPIRATORY_TRACT | Status: DC
  Filled 2022-01-16 (×2): qty 7

## 2022-01-16 MED ORDER — DOCUSATE SODIUM 100 MG PO CAPS
100.0000 mg | ORAL_CAPSULE | Freq: Two times a day (BID) | ORAL | Status: DC
  Administered 2022-01-16 – 2022-01-17 (×2): 100 mg via ORAL
  Filled 2022-01-16 (×2): qty 1

## 2022-01-16 MED ORDER — AZITHROMYCIN 250 MG PO TABS
500.0000 mg | ORAL_TABLET | Freq: Every day | ORAL | Status: DC
  Administered 2022-01-17: 500 mg via ORAL
  Filled 2022-01-16: qty 2

## 2022-01-16 MED ORDER — ENOXAPARIN SODIUM 60 MG/0.6ML IJ SOSY
0.5000 mg/kg | PREFILLED_SYRINGE | INTRAMUSCULAR | Status: DC
  Administered 2022-01-16: 52.5 mg via SUBCUTANEOUS
  Filled 2022-01-16: qty 0.6
  Filled 2022-01-16: qty 0.53

## 2022-01-16 MED ORDER — INSULIN GLARGINE-YFGN 100 UNIT/ML ~~LOC~~ SOLN
10.0000 [IU] | Freq: Every day | SUBCUTANEOUS | Status: DC
  Administered 2022-01-16 – 2022-01-17 (×2): 10 [IU] via SUBCUTANEOUS
  Filled 2022-01-16 (×2): qty 0.1

## 2022-01-16 MED ORDER — MOMETASONE FURO-FORMOTEROL FUM 200-5 MCG/ACT IN AERO
2.0000 | INHALATION_SPRAY | Freq: Two times a day (BID) | RESPIRATORY_TRACT | Status: DC
  Filled 2022-01-16 (×2): qty 8.8

## 2022-01-16 NOTE — Assessment & Plan Note (Signed)
See above

## 2022-01-16 NOTE — Assessment & Plan Note (Addendum)
Reports smoking about 2 packs a day.  Seems determined to quit and continue using nicotine patch. ?-Counseled on the importance of smoking cessation and prescribed nicotine patch. ?

## 2022-01-16 NOTE — Assessment & Plan Note (Addendum)
Blood pressure slightly elevated. ?-Continue home amlodipine, HCTZ and lisinopril. ?

## 2022-01-16 NOTE — Assessment & Plan Note (Signed)
TTE not a great quality but showed LVEF of 55 to 60%, severe concentric LVH.  Appears euvolemic on exam but difficult to assess due to body habitus.  Not on diuretics at home ?-Continue home antihypertensive meds ?

## 2022-01-16 NOTE — Evaluation (Signed)
Occupational Therapy Evaluation ?Patient Details ?Name: Sandra Solis ?MRN: [id number] ?DOB: 03/06/1961 ?Today's Date: 01/16/2022 ? ? ?History of Present Illness Patient is a 61 year old female admitted with worsening dyspnea and cough for few days. Dx with chronic hypoxia and hypercapnia. Patient did not tolerate bipap in ED therefore intubated from 5/8-5/10. PMH: COPD and asthma with chronic respiratory failure on home oxygen  ? ?Clinical Impression ?  ?Patient lives in apartment with 1 step to enter in back. Patient reports she has a home health aid "that's also my neighbor" that comes over daily for 3-4 hours to "do whatever I need." Sounds as if patient is ambulatory with a cane and primarily independent with basic ADL tasks. Patient overall supervision level for functional ambulation and sit to stands using rolling walker, O2 remained stable with activity on 2L. Patient needing max A for lower body dressing at this time 2* decreased activity tolerance. Anticipate patient will progress quickly during acute hospital stay with no follow up OT needs at D/C, will follow acutely.   ?   ? ?Recommendations for follow up therapy are one component of a multi-disciplinary discharge planning process, led by the attending physician.  Recommendations may be updated based on patient status, additional functional criteria and insurance authorization.  ? ?Follow Up Recommendations ? No OT follow up  ?  ?Assistance Recommended at Discharge PRN  ?Patient can return home with the following A little help with bathing/dressing/bathroom ? ?  ?Functional Status Assessment ? Patient has not had a recent decline in their functional status  ?Equipment Recommendations ? Tub/shower seat  ?  ?   ?Precautions / Restrictions Precautions ?Precaution Comments: monitor sats ?Restrictions ?Weight Bearing Restrictions: No  ? ?  ? ?Mobility Bed Mobility ?  ?  ?  ?  ?  ?  ?  ?General bed mobility comments: In recliner ?  ? ? ? ?  ?Balance Overall  balance assessment: Mild deficits observed, not formally tested ?  ?  ?  ?  ?  ?  ?  ?  ?  ?  ?  ?  ?  ?  ?  ?  ?  ?  ?   ? ?ADL either performed or assessed with clinical judgement  ? ?ADL Overall ADL's : Needs assistance/impaired ?Eating/Feeding: Independent ?  ?Grooming: Set up;Sitting ?  ?Upper Body Bathing: Set up;Sitting ?  ?Lower Body Bathing: Moderate assistance;Sitting/lateral leans;Sit to/from stand ?  ?Upper Body Dressing : Set up;Sitting ?  ?Lower Body Dressing: Maximal assistance;Sitting/lateral leans;Sit to/from stand ?Lower Body Dressing Details (indicate cue type and reason): To don socks ?Toilet Transfer: Supervision/safety;Ambulation;Rolling walker (2 wheels) ?Toilet Transfer Details (indicate cue type and reason): Patient able to ambulate around the room with rolling walker before stating "Its too hot in here, I'm claustrophobic" and wanting to return to recliner chair ?Toileting- Clothing Manipulation and Hygiene: Supervision/safety;Sitting/lateral lean;Sit to/from stand ?  ?  ?  ?Functional mobility during ADLs: Supervision/safety;Rolling walker (2 wheels) ?General ADL Comments: Patient appears close to her baseline, overall supervision level for functional ambulation/transfers and mod to max A for lower body dressing due to decreased activity tolerance and patient stating "there's too many wires" anticipate she will progress quickly'  ? ? ? ? ?Pertinent Vitals/Pain Pain Assessment ?Pain Assessment: No/denies pain  ? ? ? ?Hand Dominance Right ?  ?Extremity/Trunk Assessment Upper Extremity Assessment ?Upper Extremity Assessment: Overall WFL for tasks assessed ?  ?Lower Extremity Assessment ?Lower Extremity Assessment: Defer to PT evaluation ?  ?  Cervical / Trunk Assessment ?Cervical / Trunk Assessment: Normal ?  ?Communication Communication ?Communication: No difficulties ?  ?Cognition Arousal/Alertness: Awake/alert ?Behavior During Therapy: [facility] for tasks assessed/performed ?Overall Cognitive  Status: Within Functional Limits for tasks assessed ?  ?  ?  ?  ?  ?  ?  ?  ?  ?  ?  ?  ?  ?  ?  ?  ?  ?  ?  ?General Comments  VSS on 2L O2 ? ?  ?   ?   ? ? ?Home Living Family/patient expects to be discharged to:: Private residence ?Living Arrangements: Other (Comment) (grandchildren) ?Available Help at Discharge: Family ?Type of Home: Apartment ?Home Access: Stairs to enter ?Entrance Stairs-Number of Steps: 1 in back ?  ?Home Layout: One level ?  ?  ?  ?  ?  ?  ?  ?Home Equipment: Kasandra Knudsen - single point ?  ?Additional Comments: Patient states her concentrator is broken and that she needs smaller portable tanks for home use ?  ? ?  ?Prior Functioning/Environment Prior Level of Function : Needs assist ?  ?  ?  ?  ?  ?  ?Mobility Comments: Patient reports ambulatory with cane at home ?ADLs Comments: Patient states she has a home health aid that comes for 3-4 hours every day to do "whatever I need" but did not specify specific tasks. ?  ? ?  ?  ?OT Problem List: Decreased activity tolerance;Impaired balance (sitting and/or standing);Cardiopulmonary status limiting activity;Obesity ?  ?   ?OT Treatment/Interventions: Self-care/ADL training;Balance training;Patient/family education;Therapeutic activities;DME and/or AE instruction;Energy conservation  ?  ?OT Goals(Current goals can be found in the care plan section) Acute Rehab OT Goals ?Patient Stated Goal: "Get a portable oxygen tank" ?OT Goal Formulation: With patient ?Time For Goal Achievement: 01/30/22 ?Potential to Achieve Goals: Good  ?OT Frequency: Min 2X/week ?  ? ?   ?AM-PAC OT "6 Clicks" Daily Activity     ?Outcome Measure Help from another person eating meals?: None ?Help from another person taking care of personal grooming?: A Little ?Help from another person toileting, which includes using toliet, bedpan, or urinal?: A Little ?Help from another person bathing (including washing, rinsing, drying)?: A Lot ?Help from another person to put on and taking off  regular upper body clothing?: A Little ?Help from another person to put on and taking off regular lower body clothing?: A Lot ?6 Click Score: 17 ?  ?End of Session Equipment Utilized During Treatment: Oxygen ?Nurse Communication: Mobility status ? ?Activity Tolerance: Patient tolerated treatment well ?Patient left: in chair;with call bell/phone within reach;with chair alarm set;with family/visitor present ? ?OT Visit Diagnosis: Other abnormalities of gait and mobility (R26.89)  ?              ?Time: [id number] ?OT Time Calculation (min): 24 min ?Charges:  OT General Charges ?$OT Visit: 1 Visit ?OT Evaluation ?$OT Eval Low Complexity: 1 Low ? ?Delbert Phenix OT ?OT pager: [phone] ? ?Rosemary Holms ?01/16/2022, 12:20 PM ?

## 2022-01-16 NOTE — Assessment & Plan Note (Signed)
Supportive care. 

## 2022-01-16 NOTE — Progress Notes (Signed)
PHARMACIST - PHYSICIAN COMMUNICATION ?Dr:  Cyndia Skeeters ?CONCERNING: Antibiotic IV to Oral Route Change Policy ? ?RECOMMENDATION: ?This patient is receiving Azithromycin by the intravenous route.  Based on criteria approved by the Pharmacy and Therapeutics Committee, the antibiotic(s) is/are being converted to the equivalent oral dose form(s). ? ?DESCRIPTION: ?These criteria include: ?Patient being treated for a respiratory tract infection, urinary tract infection, cellulitis or clostridium difficile associated diarrhea if on metronidazole ?The patient is not neutropenic and does not exhibit a GI malabsorption state ?The patient is eating (either orally or via tube) and/or has been taking other orally administered medications for a least 24 hours ?The patient is improving clinically and has a Tmax < 100.5 ? ?If you have questions about this conversion, please contact the Pharmacy Department  ?  [phone] )  [facility]  ? ?

## 2022-01-16 NOTE — Progress Notes (Signed)
ANTICOAGULATION CONSULT NOTE ? ?Pharmacy Consult for enoxaparin ?Indication: VTE prophylaxis ? ?No Known Allergies ? ?Patient Measurements: ?Height: 5\' 3"  (160 cm) ?Weight: 104.9 kg (231 lb 4.2 oz) ?IBW/kg (Calculated) : 52.4 ? ?Vital Signs: ?Temp: 98.4 ?F (36.9 ?C) (05/11 1115) ?Temp Source: Oral (05/11 1115) ?BP: 151/55 (05/11 1005) ?Pulse Rate: 72 (05/11 1005) ? ?Labs: ?Recent Labs  ?  01/13/22 ?1719 01/14/22 ?0331 01/15/22 ?0255 01/16/22 ?0256  ?HGB 16.6* 15.6* 14.8 15.3*  ?HCT 52.5* 50.5* 46.7* 49.1*  ?PLT 228 189 206 232  ?LABPROT 13.2  --   --   --   ?INR 1.0  --   --   --   ?CREATININE 0.68 0.82 0.78 0.68  ? ? ?Estimated Creatinine Clearance: 85.6 mL/min (by C-G formula based on SCr of 0.68 mg/dL). ? ? ?Medical History: ?Past Medical History:  ?Diagnosis Date  ? Acute on chronic diastolic heart failure (HCC) 12/30/2017  ? Asthma   ? BMI 45.0-49.9, adult (HCC) 07/13/2018  ? COPD exacerbation (HCC) 01/02/2018  ? Diabetes mellitus without complication (HCC)   ? High cholesterol   ? Hypertension   ? ? ?Assessment: ?61 year old female presented with dyspnea and cough found to have acute on chronic hypoxia and hypercapnia. She was intubated 5/8-5/10, then extubated to 2 L [place]. Pharmacy consulted for enoxaparin for VTE prophylaxis. ? ? ?Plan:  ?-Enoxaparin 52.5 mg (0.5 mg/kg) q24h for BMI > 30 ?-Monitor CBC and s/sx of bleeding as indicated ?-Pharmacy to sign off consult ? ?05-04-2003, PharmD, BCPS ?Clinical Pharmacist ?01/16/2022 1:37 PM ? ? ? ?

## 2022-01-16 NOTE — Assessment & Plan Note (Signed)
Body mass index is 40.97 kg/m?. ?Encourage lifestyle change to lose weight. ?

## 2022-01-16 NOTE — Progress Notes (Signed)
?PROGRESS NOTE ? ?Sandra Solis KCM:[id number] DOB: 09-01-1961  ? ?PCP: [facility] ? ?Patient is from: Home.  Lives with children and grandchildren. ? ?DOA: 01/13/2022 LOS: 3 ? ?Chief complaints ?Chief Complaint  ?Patient presents with  ? Shortness of Breath  ?  ? 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  She was started on Solu-Medrol and antibiotics for COPD exacerbation.  She was also started on BiPAP but became agitated for which she was given an Ativan that led to obtundation and worsening of respiratory acidosis requiring intubation and mechanical ventilation from 5/8-5/10.  RVP positive for adenovirus. ? ?Eventually, patient was extubated to 2 L by nasal cannula.  She [facility] on [facility]/11.  ? ?Subjective: ?Seen and examined earlier this morning.  No major events overnight of this morning.  She says her breathing is better.  Continues to endorse productive cough with greenish phlegm.  Reports smoking about 2 packs a day.  Determined to quit.  Wants to continue using nicotine patch. ? ?Objective: ?Vitals:  ? 01/16/22 0730 01/16/22 0900 01/16/22 1005 01/16/22 1115  ?BP:  (!) 161/84 (!) 151/55   ?Pulse:  71 72   ?Resp:  14 14   ?Temp: (!) 97.2 ?F (36.2 ?C)   98.4 ?F (36.9 ?C)  ?TempSrc: Oral   Oral  ?SpO2:  93% 94%   ?Weight:      ?Height:      ? ? ?Examination: ? ?GENERAL: No apparent distress.  Nontoxic. ?HEENT: MMM.  Vision and hearing grossly intact.  ?NECK: Supple.  No apparent JVD.  ?RESP:  No IWOB.  Fair aeration bilaterally.  Somewhat rhonchorous ?CVS:  RRR. Heart sounds normal.  ?ABD/GI/GU: BS+. Abd soft, NTND.  ?MSK/EXT:  Moves extremities. No apparent  deformity. No edema.  ?SKIN: no apparent skin lesion or wound ?NEURO: Awake, alert and oriented appropriately.  No apparent focal neuro deficit. ?PSYCH: Calm. Normal affect.  ? ?Procedures:  ?Intubation and mechanical ventilation from 5/8-5/10. ? ?Microbiology summarized: ?COVID-19 and influenza PCR nonreactive. ?RVP positive for adenovirus. ?Blood cultures NGTD ? ?Assessment and Plan: ?* Acute on chronic respiratory failure with hypoxia and hypercapnia (HCC) ?In the setting of COPD exacerbation, adenovirus infection, ongoing tobacco use and encephalopathy from medications.  She is also at risk for OSA/OHS given body habitus.  ETT 5/8-5/10.  Currently saturating in mid 90s on home 2 L. ?-Transitioned to p.o. prednisone ?-Add Dulera and Incruse Ellipta. ?-Continue azithromycin and as needed nebulizers. ?-Wean oxygen to room air if able despite home 2 L ?-Encouraged tobacco cessation. ?-Needs outpatient follow-up with pulmonology for PFT and sleep study ?-Avoid sedating medication ? ? ?Acute toxic metabolic encephalopathy ?Likely from hypercapnia and iatrogenic from medication.  Resolved. ? ?Adenovirus infection ?Supportive care ? ?COPD exacerbation ([place]) ?See above ? ?Uncontrolled NIDDM-2 with hyperglycemia ?A1c 7.7%.  On metformin at home.  Hyperglycemia could be from steroid. ?Recent Labs  ?Lab 01/15/22 ?1155 01/15/22 ?1627 01/15/22 ?2209 01/16/22 ?0732 01/16/22 ?1116  ?GLUCAP 141* 218* 145* 123* 220*  ?-Continue SSI-resistant scale ?-Add Semglee 10 units daily ?-Continue home statin. ? ?Tobacco use disorder ?Reports smoking about 2 packs a day.  Seems determined to quit and continue using  nicotine patch. ?-Counseled on the importance of smoking cessation ?-Continue nicotine patch ? ?Chronic back pain ?Stable. ?-Continue home meds ? ?Obesity, Class III, BMI 40-49.9 (morbid obesity) ([place]) ?Body mass index is 40.97 kg/m?. ?Encourage lifestyle change to lose weight. ? ?Hypertension ?Blood pressure slightly  elevated. ?-Continue home amlodipine, HCTZ and lisinopril. ?-Discontinue IV fluid ?-Change as needed hydralazine to p.o. ? ?Chronic diastolic heart failure ([place]) ?TTE not a great quality but showed LVEF of 55 to 60%, severe concentric LVH.  Appears euvolemic on exam but difficult to assess due to body habitus.  Not on diuretics at home ?-Continue home antihypertensive meds ? ? ?  ?DVT prophylaxis:  ?  Start subcu Lovenox ? ?Code Status: Full code ?Family Communication: Patient and/or RN. Available if any question.  ?Level of care: Med-Surg ?Status is: Inpatient ?Remains inpatient appropriate because: Due to respiratory failure ? ?Final disposition: Likely home on 5/12. ? ?Consultants:  ?Pulmonology admitted patient ? ?Sch Meds:  ?Scheduled Meds: ? amLODipine  10 mg Oral Daily  ? [START ON 01/17/2022] azithromycin  500 mg Oral Daily  ? chlorhexidine gluconate (MEDLINE KIT)  15 mL Mouth Rinse BID  ? Chlorhexidine Gluconate Cloth  6 each Topical Daily  ? docusate sodium  100 mg Oral BID  ? lisinopril  20 mg Oral Daily  ? And  ? hydrochlorothiazide  25 mg Oral Daily  ? insulin aspart  0-20 Units Subcutaneous TID WC  ? insulin glargine-yfgn  10 Units Subcutaneous Daily  ? mometasone-formoterol  2 puff Inhalation BID  ? multivitamin with minerals  1 tablet Oral Daily  ? nicotine  14 mg Transdermal Daily  ? pantoprazole  40 mg Oral Daily  ? polyethylene glycol  17 g Oral Daily  ? pravastatin  40 mg Oral Daily  ? predniSONE  40 mg Oral Q breakfast  ? umeclidinium bromide  1 puff Inhalation Daily  ? ?Continuous Infusions: ?PRN Meds:.acetaminophen, albuterol, fentaNYL (SUBLIMAZE) injection, hydrALAZINE, ipratropium-albuterol, ondansetron (ZOFRAN) IV, phenol, polyethylene glycol ? ?Antimicrobials: ?Anti-infectives (From admission, onward)  ? ? Start     Dose/Rate Route Frequency Ordered Stop  ? 01/17/22 1000  azithromycin (ZITHROMAX) tablet 500 mg       ? 500 mg Oral Daily 01/16/22 1139 01/19/22 0959  ? 01/14/22 1000   azithromycin (ZITHROMAX) 500 mg in sodium chloride 0.9 % 250 mL IVPB  Status:  Discontinued       ? 500 mg ?250 mL/hr  Intravenous Daily 01/13/22 1920 01/16/22 1139  ? 01/14/22 1000  cefTRIAXone (ROCEPHIN) 2 g in sodium chloride 0.9 % 100 mL IVPB  Status:  Discontinued       ? 2 g ?200 mL/hr over 30 Minutes Intravenous Every 24 hours 01/13/22 1920 01/14/22 0811  ? 01/13/22 1930  cefTRIAXone (ROCEPHIN) 1 g in sodium chloride 0.9 % 100 mL IVPB  Status:  Discontinued       ? 1 g ?200 mL/hr over 30 Minutes Intravenous  Once 01/13/22 1922 01/13/22 2223  ? 01/13/22 1745  cefTRIAXone (ROCEPHIN) 1 g in sodium chloride 0.9 % 100 mL IVPB       ? 1 g ?200 mL/hr over 30 Minutes Intravenous  Once 01/13/22 1741 01/13/22 2000  ? 01/13/22 1745  azithromycin (ZITHROMAX) 500 mg in sodium chloride 0.9 % 250 mL IVPB       ? 500 mg ?250 mL/hr over 60 Minutes Intravenous  Once 01/13/22 1741 01/13/22 2000  ? ?  ? ? ? ?I have personally reviewed the following  labs and images: ?CBC: ?Recent Labs  ?Lab 01/13/22 ?1719 01/14/22 ?0331 01/15/22 ?0255 01/16/22 ?0256  ?WBC 7.1 7.2 8.0 9.8  ?NEUTROABS 5.0 6.1  --   --   ?HGB 16.6* 15.6* 14.8 15.3*  ?HCT 52.5* 50.5* 46.7* 49.1*  ?MCV 88.1 90.0 87.1 89.4  ?PLT 228 189 206 232  ? ?BMP &GFR ?Recent Labs  ?Lab 01/13/22 ?1719 01/14/22 ?0331 01/15/22 ?0255 01/16/22 ?0256  ?NA 133* 133* 135 133*  ?K 4.5 5.1 4.3 4.0  ?CL 91* 97* 98 95*  ?CO2 33* _0 ?GLUCOSE 211* 255* 196* 141*  ?BUN _1 ?CREATININE 0.68 0.82 0.78 0.68  ?CALCIUM 9.3 8.8* 9.3 9.4  ?MG  --  2.2  --   --   ?PHOS  --  3.0  2.9  --   --   ? ?Estimated Creatinine Clearance: 85.6 mL/min (by C-G formula based on SCr of 0.68 mg/dL). ?Liver & Pancreas: ?Recent Labs  ?Lab 01/13/22 ?1719 01/14/22 ?0331  ?AST 17 95*  ?ALT 10 71*  ?ALKPHOS 72 96  ?BILITOT 0.7 0.8  ?PROT 7.7 6.4*  ?ALBUMIN 3.8 3.2*  ? ?No results for input(s): LIPASE, AMYLASE in the last 168 hours. ?No results for input(s): AMMONIA in the last 168 hours. ?Diabetic: ?Recent  Labs  ?  01/14/22 ?0331  ?HGBA1C 7.7*  ? ?Recent Labs  ?Lab 01/15/22 ?1155 01/15/22 ?1627 01/15/22 ?2209 01/16/22 ?0732 01/16/22 ?1116  ?GLUCAP 141* 218* 145* 123* 220*  ? ?Cardiac Enzymes: ?No results for input(s): CKTOTAL, CKMB,

## 2022-01-16 NOTE — Hospital Course (Addendum)
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  She was started on Solu-Medrol and antibiotics for COPD exacerbation.  She was also started on BiPAP but became agitated for which she was given an Ativan that led to obtundation and worsening of respiratory acidosis requiring intubation and mechanical ventilation from 5/8-5/10.  RVP positive for adenovirus. ? ?Eventually, patient was extubated to 2 L by nasal cannula.  She was [facility] to [facility] on 5/11 and remained stable.. ? ?On the day of discharge, patient desaturated to 80% with ambulation on room air but recovered to 90s on 2 L by nasal cannula.  She is discharged on prednisone, azithromycin, Symbicort, Spiriva and as needed albuterol.  She will follow-up with pulmonology and PCP.  She has been extensively counseled to quit smoking cigarette.  She is planning to use nicotine patch moving forward.   ? ?Patient was evaluated by therapy and no need was identified. ? ? ?

## 2022-01-16 NOTE — Assessment & Plan Note (Signed)
Stable.  Continue home meds. ?

## 2022-01-16 NOTE — Assessment & Plan Note (Addendum)
In the setting of COPD exacerbation, adenovirus infection, ongoing tobacco use and encephalopathy from medications.  She is also at risk for OSA/OHS given body habitus.  ETT 5/8-5/10.   ?-Discharged on prednisone, azithromycin, Symbicort, Spiriva and albuterol ?-Outpatient follow-up with pulmonology for PFT and sleep study ?-Encourage smoking cessation.  Prescribed nicotine patch ?-Avoid sedating medication ? ?

## 2022-01-16 NOTE — Evaluation (Signed)
Physical Therapy Evaluation ?Patient Details ?Name: Sandra Solis ?MRN: [id number] ?DOB: [birth year] ?Today's Date: 01/16/2022 ? ?History of Present Illness ? Patient is a 61 year old female admitted with worsening dyspnea and cough for few days. Dx with chronic hypoxia and hypercapnia. Patient did not tolerate bipap in ED therefore intubated from 5/8-5/10. PMH: COPD and asthma with chronic respiratory failure on home oxygen  ?Clinical Impression ?  The patient ambulated in room using Rw with supervision on 2  L [place], SPO2  >95%.  ? Patient requesting assistance from [facility] for oxygen at home( Hers is broken). ? Patient has family support. Will assess need for RW nearer to Dc. ?Pt admitted with above diagnosis.  Pt currently with functional limitations due to the deficits listed below (see PT Problem List). Pt will benefit from skilled PT to increase their independence and safety with mobility to allow discharge to the venue listed below.   ?  ?   ? ?Recommendations for follow up therapy are one component of a multi-disciplinary discharge planning process, led by the attending physician.  Recommendations may be updated based on patient status, additional functional criteria and insurance authorization. ? ?Follow Up Recommendations No PT follow up ? ?  ?Assistance Recommended at Discharge PRN  ?Patient can return home with the following ? A little help with walking and/or transfers;A little help with bathing/dressing/bathroom;Help with stairs or ramp for entrance;Assist for transportation;Assistance with cooking/housework ? ?  ?Equipment Recommendations None recommended by PT  ?Recommendations for Other Services ?    ?  ?Functional Status Assessment Patient has had a recent decline in their functional status and demonstrates the ability to make significant improvements in function in a reasonable and predictable amount of time.  ? ?  ?Precautions / Restrictions Precautions ?Precaution Comments: monitor  sats ?Restrictions ?Weight Bearing Restrictions: No  ? ?  ? ?Mobility ? Bed Mobility ?  ?  ?  ?  ?  ?  ?  ?General bed mobility comments: In recliner ?  ? ?Transfers ?Overall transfer level: Needs assistance ?Equipment used: Rolling walker (2 wheels) ?Transfers: Sit to/from Stand ?Sit to Stand: Supervision ?  ?  ?  ?  ?  ?  ?  ? ?Ambulation/Gait ?Ambulation/Gait assistance: Supervision ?Gait Distance (Feet): 30 Feet ?Assistive device: Rolling walker (2 wheels) ?Gait Pattern/deviations: Step-to pattern, Step-through pattern ?Gait velocity: decr ?  ?  ?General Gait Details: gait steady, turns around  in room using RW ? ?Stairs ?  ?  ?  ?  ?  ? ?Wheelchair Mobility ?  ? ?Modified Rankin (Stroke Patients Only) ?  ? ?  ? ?Balance Overall balance assessment: Mild deficits observed, not formally tested ?  ?  ?  ?  ?  ?  ?  ?  ?  ?  ?  ?  ?  ?  ?  ?  ?  ?  ?   ? ? ? ?Pertinent Vitals/Pain Pain Assessment ?Pain Assessment: No/denies pain  ? ? ?Home Living Family/patient expects to be discharged to:: Private residence ?Living Arrangements: Other (Comment) (grandchildren) ?Available Help at Discharge: Personal care attendant (3-5 hours  per day) ?Type of Home: Apartment ?Home Access: Stairs to enter ?  ?Entrance Stairs-Number of Steps: 1 in back ?  ?Home Layout: One level ?Home Equipment: Gil[place] Mor - single point ?Additional Comments: Patient states her concentrator is broken and that she needs smaller portable tanks for home use,  ?  ?Prior Function Prior Level of Function : Needs assist ?  ?  ?  ?  ?  ?  ?  Mobility Comments: Patient reports ambulatory with cane at home ?ADLs Comments: Patient states she has a home health aid that comes for 3-4 hours every day to do "whatever I need" but did not specify specific tasks. ?  ? ? ?Hand Dominance  ? Dominant Hand: Right ? ?  ?Extremity/Trunk Assessment  ? Upper Extremity Assessment ?Upper Extremity Assessment: Overall WFL for tasks assessed ?  ? ?Lower Extremity Assessment ?Lower  Extremity Assessment: Overall WFL for tasks assessed ?  ? ?Cervical / Trunk Assessment ?Cervical / Trunk Assessment: Normal  ?Communication  ? Communication: No difficulties  ?Cognition   ?  ?  ?  ?  ?  ?  ?  ?  ?  ?  ?  ?  ?  ?  ?  ?  ?  ?  ?  ?  ?  ? ?  ?General Comments General comments (skin integrity, edema, etc.): VSS on 2L O2 ? ?  ?Exercises    ? ?Assessment/Plan  ?  ?PT Assessment Patient needs continued PT services  ?PT Problem List Decreased strength;Decreased activity tolerance ? ?   ?  ?PT Treatment Interventions DME instruction;Therapeutic activities;Gait training;Therapeutic exercise;Patient/family education;Functional mobility training   ? ?PT Goals (Current goals can be found in the Care Plan section)  ?Acute Rehab PT Goals ?Patient Stated Goal: to go home ?PT Goal Formulation: With patient/family ?Time For Goal Achievement: 01/30/22 ?Potential to Achieve Goals: Good ? ?  ?Frequency Min 3X/week ?  ? ? ?Co-evaluation   ?  ?  ?  ?  ? ? ?  ?AM-PAC PT "6 Clicks" Mobility  ?Outcome Measure Help needed turning from your back to your side while in a flat bed without using bedrails?: None ?Help needed moving from lying on your back to sitting on the side of a flat bed without using bedrails?: None ?Help needed moving to and from a bed to a chair (including a wheelchair)?: A Little ?Help needed standing up from a chair using your arms (e.g., wheelchair or bedside chair)?: A Little ?Help needed to walk in hospital room?: A Little ?Help needed climbing 3-5 steps with a railing? : A Lot ?6 Click Score: 19 ? ?  ?End of Session Equipment Utilized During Treatment: Oxygen ?Activity Tolerance: Patient limited by fatigue ?Patient left: in chair;with call bell/phone within reach ?Nurse Communication: Mobility status ?PT Visit Diagnosis: Difficulty in walking, not elsewhere classified (R26.2) ?  ? ?Time: [phone] ?PT Time Calculation (min) (ACUTE ONLY): 25 min ? ? ?Charges:   PT Evaluation ?$PT Eval Low Complexity: 1  Low ?  ?  ?   ? ? ?Blanchard Kelch PT ?Acute Rehabilitation Services ?Pager [phone] ?Office [phone] ? ? ?Dixie Coppa, Jobe Igo ?01/16/2022, 12:55 PM ? ?

## 2022-01-16 NOTE — Assessment & Plan Note (Addendum)
A1c 7.7%.  On metformin at home.  Hyperglycemia likely due to steroid. ?-Continue home metformin ?

## 2022-01-16 NOTE — Assessment & Plan Note (Signed)
Likely from hypercapnia and iatrogenic from medication.  Resolved. ?

## 2022-01-16 NOTE — Progress Notes (Signed)
Pt refused nocturnal bipap.  Pt encouraged to call should she change her mind. ?

## 2022-01-17 DIAGNOSIS — J9621 Acute and chronic respiratory failure with hypoxia: Secondary | ICD-10-CM | POA: Diagnosis not present

## 2022-01-17 DIAGNOSIS — G9340 Encephalopathy, unspecified: Secondary | ICD-10-CM | POA: Diagnosis not present

## 2022-01-17 DIAGNOSIS — B340 Adenovirus infection, unspecified: Secondary | ICD-10-CM | POA: Diagnosis not present

## 2022-01-17 DIAGNOSIS — J441 Chronic obstructive pulmonary disease with (acute) exacerbation: Secondary | ICD-10-CM | POA: Diagnosis not present

## 2022-01-17 LAB — BASIC METABOLIC PANEL
Anion gap: 9 (ref 5–15)
BUN: 14 mg/dL (ref 8–23)
CO2: 31 mmol/L (ref 22–32)
Calcium: 9.4 mg/dL (ref 8.9–10.3)
Chloride: 92 mmol/L — ABNORMAL LOW (ref 98–111)
Creatinine, Ser: 0.66 mg/dL (ref 0.44–1.00)
GFR, Estimated: >60 mL/min (ref >60)
Glucose, Bld: 123 mg/dL — ABNORMAL HIGH (ref 70–99)
Potassium: 4 mmol/L (ref 3.5–5.1)
Sodium: 132 mmol/L — ABNORMAL LOW (ref 135–145)

## 2022-01-17 LAB — CBC
HCT: 46.9 % — ABNORMAL HIGH (ref 36.0–46.0)
Hemoglobin: 14.9 g/dL (ref 12.0–15.0)
MCH: 28.3 pg (ref 26.0–34.0)
MCHC: 31.8 g/dL (ref 30.0–36.0)
MCV: 89 fL (ref 80.0–100.0)
Platelets: 245 10*3/uL (ref 150–400)
RBC: 5.27 MIL/uL — ABNORMAL HIGH (ref 3.87–5.11)
RDW: 13.9 % (ref 11.5–15.5)
WBC: 9.5 10*3/uL (ref 4.0–10.5)
nRBC: 0 % (ref 0.0–0.2)

## 2022-01-17 LAB — GLUCOSE, CAPILLARY
Glucose-Capillary: 163 mg/dL — ABNORMAL HIGH (ref 70–99)
Glucose-Capillary: 256 mg/dL — ABNORMAL HIGH (ref 70–99)

## 2022-01-17 MED ORDER — AZITHROMYCIN 250 MG PO TABS: 250.0000 mg | ORAL_TABLET | Freq: Every day | ORAL | 0 refills | Status: AC

## 2022-01-17 MED ORDER — NICOTINE 21 MG/24HR TD PT24
21.0000 mg | MEDICATED_PATCH | Freq: Every day | TRANSDERMAL | 1 refills | Status: AC
Start: 2022-01-17 — End: ?

## 2022-01-17 MED ORDER — AMLODIPINE BESYLATE 10 MG PO TABS: 10.0000 mg | ORAL_TABLET | Freq: Every day | ORAL | 1 refills | Status: AC

## 2022-01-17 MED ORDER — SPIRIVA HANDIHALER 18 MCG IN CAPS: 18.0000 ug | ORAL_CAPSULE | Freq: Every day | RESPIRATORY_TRACT | 2 refills | Status: AC

## 2022-01-17 MED ORDER — SYMBICORT 160-4.5 MCG/ACT IN AERO: 2.0000 | INHALATION_SPRAY | Freq: Two times a day (BID) | RESPIRATORY_TRACT | 1 refills | Status: AC

## 2022-01-17 MED ORDER — ALBUTEROL SULFATE HFA 108 (90 BASE) MCG/ACT IN AERS
2.0000 | INHALATION_SPRAY | Freq: Four times a day (QID) | RESPIRATORY_TRACT | 1 refills | Status: AC | PRN
Start: 2022-01-17 — End: ?

## 2022-01-17 NOTE — TOC Transition Note (Signed)
Transition of Care (TOC) - CM/SW Discharge Note ? ? ?Patient Details  ?Name: Sandra Solis ?MRN: [id number] ?Date of Birth: February 15, 1961 ? ?Transition of Care (TOC) CM/SW Contact:  ?Fantasy Donald, Marjie Skiff, RN ?Phone Number: ?01/17/2022, 2:47 PM ? ? ?Clinical Narrative:    ?Pt to dc home with new order for home 02. [employer] contacted to deliver 02 to pt room.  ? ? ?Final next level of care: Home/Self Care ?Barriers to Discharge: No Barriers Identified ? ? ?Patient Goals and CMS Choice ?Patient states their goals for this hospitalization and ongoing recovery are:: To go home ?CMS Medicare.gov Compare Post Acute Care list provided to:: Patient ?Choice offered to / list presented to : Patient ? ? ?Discharge Plan and Services ?  ?Discharge Planning Services: CM Consult ?Post Acute Care Choice: Durable Medical Equipment          ?DME Arranged: Oxygen ?DME Agency: [employer] ?Date DME Agency Contacted: 01/17/22 ?Time DME Agency Contacted: [phone] ?Representative spoke with at DME Agency: Brenton Grills ?  ?  ?Readmission Risk Interventions ?   ? View : No data to display.  ?  ?  ?  ? ? ? ? ? ?

## 2022-01-17 NOTE — Plan of Care (Signed)
?  Problem: Education: ?Goal: Knowledge of General Education information will improve ?Description: Including pain rating scale, medication(s)/side effects and non-pharmacologic comfort measures ?Outcome: Progressing ?  ?Problem: Activity: ?Goal: Risk for activity intolerance will decrease ?Outcome: Progressing ?  ?Problem: Pain Managment: ?Goal: General experience of comfort will improve ?Outcome: Progressing ?  ?Problem: Safety: ?Goal: Ability to remain free from injury will improve ?Outcome: Progressing ?  ?Problem: Skin Integrity: ?Goal: Risk for impaired skin integrity will decrease ?Outcome: Progressing ?  ?Problem: Education: ?Goal: Knowledge of disease or condition will improve ?Outcome: Progressing ?Goal: Knowledge of the prescribed therapeutic regimen will improve ?Outcome: Progressing ?Goal: Individualized Educational Video(s) ?Outcome: Progressing ?  ?Problem: Respiratory: ?Goal: Ability to maintain a clear airway will improve ?Outcome: Progressing ?Goal: Levels of oxygenation will improve ?Outcome: Progressing ?Goal: Ability to maintain adequate ventilation will improve ?Outcome: Progressing ?  ?Problem: Safety: ?Goal: Non-violent Restraint(s) ?Outcome: Progressing ?  ?

## 2022-01-17 NOTE — Discharge Summary (Signed)
? ?Physician Discharge Summary  ?Sandra Solis AUQ:[id number] DOB: 10/24/1960 DOA: 01/13/2022 ? ?PCP: [facility] ? ?Admit date: 01/13/2022 ?Discharge date: 01/17/2022 ?Admitted From: Home ?Disposition: Home ?Recommendations for Outpatient Follow-up:  ?Follow ups as below. ?Please obtain CBC/BMP/Mag at follow up ?Please follow up on the following pending results: None ? ?Home Health: Not indicated ?Equipment/Devices: Home 2 L oxygen ? ?Discharge Condition: Stable ?CODE STATUS: Full code ? Follow-up Information   ? ? Luciano Cutter, MD. Schedule an appointment as soon as possible for a visit today.   ?Specialty: Pulmonary Disease ?Why: Please call our office to arrange hospital follow-up for COPD management once you are discharged ?Contact information: ?[address] ?[address] ?[place] [address] ?[phone] ? ? ?  ?  ? ? [facility]. Schedule an appointment as soon as possible for a visit in 1 week(s).   ?Specialty: Pediatrics ?Contact information: ?[address] ?[place] [address] ?[phone] ? ? ?  ?  ? ?  ?  ? ?  ? ? 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  She was started on Solu-Medrol and antibiotics for COPD exacerbation.  She was also started on BiPAP but became agitated for which she was given an Ativan that led to obtundation and worsening of respiratory acidosis requiring intubation and mechanical ventilation from 5/8-5/10.  RVP positive for adenovirus. ? ?Eventually, patient was extubated to 2 L by nasal cannula.  [facility]11 and remained stable.. ? ?On the day of discharge, patient desaturated to 80% with ambulation on room  air but recovered to 90s on 2 L by nasal cannula.  She is discharged on prednisone, azithromycin, Symbicort, Spiriva and as needed albuterol.  She will follow-up with pulmonology and PCP.  She has been extensively counseled to quit smoking cigarette.  She is planning to use nicotine patch moving forward.   ? ?Patient was evaluated by therapy and no need was identified. ? ?  ? ?See individual problem list below for more on hospital course. ? ?Problems addressed during this hospitalization ?Problem  ?Acute On Chronic Respiratory Failure With Hypoxia and Hypercapnia (Hcc)  ?Adenovirus Infection  ?Acute toxic metabolic encephalopathy  ?Copd Exacerbation (Hcc)  ?Tobacco Use Disorder  ?Uncontrolled NIDDM-2 with hyperglycemia  ?Chronic Back Pain  ?Obesity, Class III, Bmi 40-49.9 (Morbid Obesity) (Hcc)  ?Chronic Diastolic Heart Failure (Hcc)  ?Hypertension  ?  ?Assessment and Plan: ?* Acute on chronic respiratory failure with hypoxia and hypercapnia (HCC) ?In the setting of COPD exacerbation, adenovirus infection, ongoing tobacco use and encephalopathy from medications.  She is also at risk for OSA/OHS given body habitus.  ETT 5/8-5/10.   ?-Discharged on prednisone, azithromycin, Symbicort, Spiriva and albuterol ?-Outpatient follow-up with pulmonology for PFT and sleep study ?-Encourage smoking cessation.  Prescribed nicotine patch ?-Avoid sedating medication ? ? ?Acute toxic metabolic encephalopathy ?Likely from hypercapnia and iatrogenic from medication.  Resolved. ? ?Adenovirus infection ?Supportive care ? ?COPD exacerbation (HCC) ?See above ? ?Uncontrolled NIDDM-2 with hyperglycemia ?A1c 7.7%.  On metformin at home.  Hyperglycemia likely due to steroid. ?-Continue home metformin ? ?Tobacco use disorder ?Reports smoking about 2 packs a day.  Seems  determined to quit and continue using nicotine patch. ?-Counseled on the importance of smoking cessation and prescribed nicotine patch. ? ?Chronic back pain ?Stable. ?-Continue  home meds ? ?Obesity, Class III, BMI 40-49.9 (morbid obesity) (HCC) ?Body mass index is 40.97 kg/m?. ?Encourage lifestyle change to lose weight. ? ?Hypertension ?Blood pressure slightly elevated. ?-Continue home amlodipine, HCTZ and lisinopril. ? ?Chronic diastolic heart failure (HCC) ?TTE not a great quality but showed LVEF of 55 to 60%, severe concentric LVH.  Appears euvolemic on exam but difficult to assess due to body habitus.  Not on diuretics at home ?-Continue home antihypertensive meds ? ? ? ? ?  ?Nutrition Problem: Increased nutrient needs ?Etiology: acute illness ?Signs/Symptoms: estimated needs ?Interventions: Refer to RD note for recommendations ? ?  ? ?Vital signs ?Vitals:  ? 01/16/22 2009 01/17/22 0500 01/17/22 0521 01/17/22 1410  ?BP: (!) 163/66  (!) 145/71 (!) 158/66  ?Pulse: 80  69 90  ?Temp: 98.1 ?F (36.7 ?C)  98.2 ?F (36.8 ?C) 98.3 ?F (36.8 ?C)  ?Resp: 20  18 18   ?Height:      ?Weight:  104.3 kg    ?SpO2: 97%  96% 96%  ?TempSrc: Oral  Oral Oral  ?BMI (Calculated):  40.74    ?  ? ?Discharge exam ? ?GENERAL: No apparent distress.  Nontoxic. ?HEENT: MMM.  Vision and hearing grossly intact.  ?NECK: Supple.  No apparent JVD.  ?RESP:  No IWOB.  Fair aeration bilaterally.  Upper airway sound ?CVS:  RRR. Heart sounds normal.  ?ABD/GI/GU: BS+. Abd soft, NTND.  ?MSK/EXT:  Moves extremities. No apparent deformity. No edema.  ?SKIN: no apparent skin lesion or wound ?NEURO: Awake and alert. Oriented appropriately.  No apparent focal neuro deficit. ?PSYCH: Calm. Normal affect.  ? ?Discharge Instructions ?Discharge Instructions   ? ? Call MD for:  difficulty breathing, headache or visual disturbances   Complete by: As directed ?  ? Diet - low sodium heart healthy   Complete by: As directed ?  ? Diet Carb Modified   Complete by: As directed ?  ? Discharge instructions   Complete by: As directed ?  ? It has been a pleasure taking care of you! ? ?You were hospitalized due to COPD exacerbation likely due to  adenovirus infection and smoking cigarettes.  Your symptoms improved with treatment.  It is very important that you quit smoking cigarettes and use your inhalers as prescribed.  Please follow-up with your primary care doctor in 1 to 2 weeks or sooner if needed.  Follow-up with the lung doctors for evaluation of sleep apnea and COPD.  Review your new medication list and the directions on your medications before you take them. ? ? ? ?It is important that you quit smoking cigarettes.  You may use nicotine patch to help you quit smoking.  Nicotine patch is available over-the-counter.  You may also discuss other options to help you quit smoking with your primary care doctor. You can also talk to professional counselors at 1-800-QUIT-NOW [phone]) for free smoking cessation counseling. ? ? ? ? ?Take care,  ? Increase activity slowly   Complete by: As directed ?  ? ?  ? ?Allergies as of 01/17/2022   ?No Known Allergies ?  ? ?  ?Medication List  ?  ? ?TAKE these medications   ? ?acetaminophen 500 MG tablet ?Commonly known as: TYLENOL ?Take 1,000 mg by mouth every 6 (six) hours as needed for mild pain. ?  ?albuterol (2.5 MG/3ML) 0.083% nebulizer  solution ?Commonly known as: PROVENTIL ?Take 2.5 mg by nebulization every 6 (six) hours as needed for wheezing or shortness of breath. ?  ?albuterol 108 (90 Base) MCG/ACT inhaler ?Commonly known as: VENTOLIN HFA ?Inhale 2 puffs into the lungs every 6 (six) hours as needed for wheezing or shortness of breath. ?  ?amLODipine 10 MG tablet ?Commonly known as: NORVASC ?Take 1 tablet (10 mg total) by mouth daily. ?What changed:  ?medication strength ?how much to take ?  ?azithromycin 250 MG tablet ?Commonly known as: ZITHROMAX ?Take 1 tablet (250 mg total) by mouth daily. ?Start taking on: Jan 18, 2022 ?  ?cetirizine 10 MG tablet ?Commonly known as: ZYRTEC ?Take 10 mg by mouth daily as needed for allergies. ?  ?gabapentin 300 MG capsule ?Commonly known as: NEURONTIN ?Take 300 mg by  mouth 2 (two) times daily. ?  ?lisinopril-hydrochlorothiazide 20-25 MG tablet ?Commonly known as: ZESTORETIC ?Take 1 tablet by mouth daily. ?  ?metFORMIN 1000 MG tablet ?Commonly known as: GLUCOPHAGE ?Take 1,

## 2022-01-17 NOTE — Progress Notes (Addendum)
SATURATION QUALIFICATIONS: (This note is used to comply with regulatory documentation for home oxygen) ? ?Patient Saturations on Room Air at Rest = 92% ? ?Patient Saturations on Room Air while Ambulating = 80% ? ?Patient Saturations on 2 Liters of oxygen while Ambulating = 92% ? ?Please briefly explain why patient needs home oxygen: Desaturates without O2 ?

## 2022-01-18 LAB — CULTURE, BLOOD (ROUTINE X 2)
Culture: NO GROWTH
Culture: NO GROWTH
Special Requests: ADEQUATE
Special Requests: ADEQUATE

## 2022-01-21 ENCOUNTER — Inpatient Hospital Stay: Payer: Medicaid Other | Admitting: Primary Care

## 2022-01-30 ENCOUNTER — Ambulatory Visit (INDEPENDENT_AMBULATORY_CARE_PROVIDER_SITE_OTHER): Payer: Medicaid Other | Admitting: Primary Care

## 2022-01-30 ENCOUNTER — Encounter: Payer: Self-pay | Admitting: Primary Care

## 2022-01-30 VITALS — BP 106/60 | HR 83 | Temp 97.9°F | Ht 63.0 in | Wt 233.4 lb

## 2022-01-30 DIAGNOSIS — J9622 Acute and chronic respiratory failure with hypercapnia: Secondary | ICD-10-CM

## 2022-01-30 DIAGNOSIS — F17200 Nicotine dependence, unspecified, uncomplicated: Secondary | ICD-10-CM

## 2022-01-30 DIAGNOSIS — J449 Chronic obstructive pulmonary disease, unspecified: Secondary | ICD-10-CM | POA: Diagnosis not present

## 2022-01-30 DIAGNOSIS — R0683 Snoring: Secondary | ICD-10-CM

## 2022-01-30 DIAGNOSIS — J441 Chronic obstructive pulmonary disease with (acute) exacerbation: Secondary | ICD-10-CM

## 2022-01-30 DIAGNOSIS — J9621 Acute and chronic respiratory failure with hypoxia: Secondary | ICD-10-CM | POA: Diagnosis not present

## 2022-01-30 NOTE — Patient Instructions (Addendum)
Congratulations on quitting smoking  Recommendations: Continue symbicort two puffs morning and evening Continue Spiriva two puffs daily in the morning Use albuterol 2 puffs every 6 hours as needed for shrotness of breath and wheezing Continue to abstain from cigarettes Wear 2 L oxygen 24/7 to maintain O2 >88-90%   Orders: Pulmonary function testing (ordered) Split night sleep study (ordered) Walk for POC today if able   Follow-up: 6-8 weeks fu with Sandra Solis with 1 hour PFTs prior (If nothing available with Sandra Solis ok to see [facility]) / review sleep study results    You can receive free nicotine replacement therapy ( patches, gum or mints) by calling 1-800-QUIT NOW. Please call so we can get you on the path to becoming  a non-smoker. I know it is hard, but you can do this!

## 2022-01-30 NOTE — Progress Notes (Signed)
@Patient  ID: , female    DOB: August 07, 1961, 61 y.o.   MRN: [id number]  Chief Complaint  Patient presents with   Hospitalization Follow-up    Sob with exertion.  Grand children live with her and it overwhelms her.      Referring provider: [facility]  HPI: 61 year old female, every day smoker.  Past medical history significant for chronic diastolic heart failure, hypertension, COPD, chronic respiratory failure with hypoxia, obesity.  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  She was started on Solu-Medrol and antibiotics for COPD exacerbation.  She was also started on BiPAP but became agitated for which she was given an Ativan that led to obtundation and worsening of respiratory acidosis requiring intubation and mechanical ventilation from 5/8-5/10.  RVP positive for adenovirus.   Eventually, patient was extubated to 2 L by nasal cannula.  She was transferred to [facility] on 5/11 and remained stable..   On the day of discharge, patient desaturated to 80% with ambulation on room air but recovered to 90s on 2 L by nasal cannula.  She is discharged on prednisone, azithromycin, Symbicort, Spiriva and as needed albuterol.  She will follow-up with pulmonology and PCP.  She has been extensively counseled to quit smoking cigarette.  She is planning to use nicotine patch moving forward.     Patient was evaluated by therapy and no need was identified.  01/30/2022- Hosp follow-up Patient presents today for hospital follow-up.  She was admitted from 01/13/2022 - 01/17/2022 for COPD exacerbation and acute on chronic respiratory failure. She presented with worsening dyspnea and cough and found to have  acute on chronic hypoxia and hypercapnia on ABGS. She was stated on Solumedrol and antibiotics for COPD exacerbation. She was started on BIPAP but became agitated and was given ativan which led to worsening resp acidosis requiring intubation and mechanical ventilation from 5/8-5/10. RVP was positive for adenovirus. She was discharged on zpack, prednisone, symbicort, spiriva, prn albuterol and 2L supplemental oxygen.   She has been feeling so much better. Breathing has been pretty good. She is compliant with Symbicort and Spiriva daily as directed. She has only needed to use albuterol a couple times since being discharged. She is using IS twice a day 10x. She has quit smoking since admission. Needs prescription for nicotine patch. She is on 2L oxygen. Wanting to be qualified for POC. Concern for sleep apnea. She falls asleep easily during the day. She snores loudly.   No Known Allergies   There is no immunization history on file for this patient.  Past Medical History:  Diagnosis Date   Acute on chronic diastolic heart failure (HCC) 12/30/2017   Asthma    BMI 45.0-49.9, adult (HCC) 07/13/2018   COPD exacerbation (HCC) 01/02/2018   Diabetes mellitus without complication (HCC)    High cholesterol    Hypertension     Tobacco History: Social History   Tobacco Use  Smoking Status Former   Packs/day: 1.00   Years: 15.00   Total pack years: 15.00   Types: Cigarettes   Quit date: 01/13/2022   Years since quitting: 0.2  Smokeless Tobacco Never  Tobacco Comments   Has not stopped since she in  the hospital.  01/30/2022 hfb   Counseling given: Not Answered Tobacco comments: Has not stopped since she in the hospital.  01/30/2022 hfb   Outpatient Medications Prior to Visit  Medication Sig Dispense Refill   acetaminophen (TYLENOL) 500 MG tablet Take 1,000 mg by mouth every 6 (six) hours as needed for mild pain.     albuterol (PROVENTIL) (2.5 MG/3ML) 0.083% nebulizer solution Take 2.5 mg by  nebulization every 6 (six) hours as needed for wheezing or shortness of breath.     albuterol (VENTOLIN HFA) 108 (90 Base) MCG/ACT inhaler Inhale 2 puffs into the lungs every 6 (six) hours as needed for wheezing or shortness of breath. 8 g 1   amLODipine (NORVASC) 10 MG tablet Take 1 tablet (10 mg total) by mouth daily. 90 tablet 1   azithromycin (ZITHROMAX) 250 MG tablet Take 1 tablet (250 mg total) by mouth daily. 2 each 0   cetirizine (ZYRTEC) 10 MG tablet Take 10 mg by mouth daily as needed for allergies.     gabapentin (NEURONTIN) 300 MG capsule Take 300 mg by mouth 2 (two) times daily.     lisinopril-hydrochlorothiazide (PRINZIDE,ZESTORETIC) 20-25 MG tablet Take 1 tablet by mouth daily.  3   metFORMIN (GLUCOPHAGE) 1000 MG tablet Take 1,000 mg by mouth 2 (two) times daily.  3   nicotine (NICODERM CQ - DOSED IN MG/24 HOURS) 21 mg/24hr patch Place 1 patch (21 mg total) onto the skin daily. 28 patch 1   omeprazole (PRILOSEC) 20 MG capsule Take 20 mg by mouth daily.     pravastatin (PRAVACHOL) 40 MG tablet Take 40 mg by mouth daily.  3   sodium chloride (OCEAN) 0.65 % SOLN nasal spray Place 1 spray into both nostrils as needed for congestion. 1 Bottle 0   SYMBICORT 160-4.5 MCG/ACT inhaler Inhale 2 puffs into the lungs 2 (two) times daily. 1 each 1   tiotropium (SPIRIVA HANDIHALER) 18 MCG inhalation capsule Place 1 capsule (18 mcg total) into inhaler and inhale daily. 30 capsule 2   QUEtiapine (SEROQUEL) 50 MG tablet Take 50 mg by mouth at bedtime as needed (for sleep). (Patient not taking: Reported on 01/30/2022)     No facility-administered medications prior to visit.   Review of Systems  Review of Systems  Constitutional: Negative.   HENT: Negative.    Respiratory:  Positive for cough. Negative for chest tightness, shortness of breath and wheezing.      Physical Exam  BP 106/60 (BP Location: Left Arm, Patient Position: Sitting, Cuff Size: Large)   Pulse 83   Temp 97.9 F (36.6 C)  (Oral)   Ht 5\' 3"  (1.6 m)   Wt 233 lb 6.4 oz (105.9 kg)   SpO2 99%   BMI 41.34 kg/m  Physical Exam Constitutional:      Appearance: Normal appearance. She is obese. She is not ill-appearing.  HENT:     Head: Normocephalic and atraumatic.  Cardiovascular:     Rate and Rhythm: Normal rate and regular rhythm.     Comments: RRR Pulmonary:     Effort: Pulmonary effort is normal.     Breath sounds: Normal breath sounds. No wheezing, rhonchi or rales.     Comments: CTA; 2L  Neurological:     General: No focal deficit present.     Mental Status: She is alert and oriented to person, place, and time. Mental status is at baseline.  Psychiatric:        Mood and Affect: Mood normal.  Behavior: Behavior normal.        Thought Content: Thought content normal.        Judgment: Judgment normal.      Lab Results:  CBC    Component Value Date/Time   WBC 9.5 01/17/2022 0510   RBC 5.27 (H) 01/17/2022 0510   HGB 14.9 01/17/2022 0510   HCT 46.9 (H) 01/17/2022 0510   PLT 245 01/17/2022 0510   MCV 89.0 01/17/2022 0510   MCH 28.3 01/17/2022 0510   MCHC 31.8 01/17/2022 0510   RDW 13.9 01/17/2022 0510   LYMPHSABS 0.9 01/14/2022 0331   MONOABS 0.2 01/14/2022 0331   EOSABS 0.0 01/14/2022 0331   BASOSABS 0.0 01/14/2022 0331    BMET    Component Value Date/Time   NA 132 (L) 01/17/2022 0510   K 4.0 01/17/2022 0510   CL 92 (L) 01/17/2022 0510   CO2 31 01/17/2022 0510   GLUCOSE 123 (H) 01/17/2022 0510   BUN 14 01/17/2022 0510   CREATININE 0.66 01/17/2022 0510   CALCIUM 9.4 01/17/2022 0510   GFRNONAA >60 01/17/2022 0510   GFRAA >60 01/01/2018 0326    BNP    Component Value Date/Time   BNP 383.1 (H) 01/14/2022 0331    ProBNP No results found for: "PROBNP"  Imaging: No results found.   Assessment & Plan:   COPD exacerbation (HCC) - Admitted 01/13/22-01/17/22 for AECOPD. Treated with solumedrol and antibiotics. Discharged on zpack and prednisone. Clinically improved.  Feeling significantly better. Continue symbicort two puffs morning and evening, Spiriva two puffs daily and albuterol 2 puffs every 6 hours as needed for shrotness of breath and wheezing.   Acute on chronic respiratory failure with hypoxia and hypercapnia (HCC) - Required BIPAP and eventually intubation and mechanical ventilation while in-patient from 01/13/22-01/15/22. Discharged on 2L supplemental oxygen. Qualified for POC today. [place]nern for sleep apnea, needs sleep study.   Tobacco use disorder - She has not smoked since most recent admission, continue to encourage abstinence   Loud snoring - Patient falls asleep easily during the day and snores loudly. Strong suspicion patient may have sleep apnea, needs split night sleep study in-lab   Glenford Bayley, NP 04/17/2022

## 2022-02-28 ENCOUNTER — Encounter (HOSPITAL_BASED_OUTPATIENT_CLINIC_OR_DEPARTMENT_OTHER): Payer: Medicaid Other | Admitting: Pulmonary Disease

## 2022-03-13 ENCOUNTER — Ambulatory Visit: Payer: Medicaid Other | Admitting: Pulmonary Disease

## 2022-04-03 ENCOUNTER — Encounter (HOSPITAL_BASED_OUTPATIENT_CLINIC_OR_DEPARTMENT_OTHER): Payer: Medicaid Other | Admitting: Pulmonary Disease

## 2022-04-17 DIAGNOSIS — R0683 Snoring: Secondary | ICD-10-CM | POA: Insufficient documentation

## 2022-04-17 NOTE — Assessment & Plan Note (Addendum)
-   Required BIPAP and eventually intubation and mechanical ventilation while in-patient from 01/13/22-01/15/22. Discharged on 2L supplemental oxygen. Qualified for POC today. [place]nern for sleep apnea, needs sleep study.

## 2022-04-17 NOTE — Assessment & Plan Note (Signed)
-   She has not smoked since most recent admission, continue to encourage abstinence

## 2022-04-17 NOTE — Assessment & Plan Note (Signed)
-   Admitted 01/13/22-01/17/22 for AECOPD. Treated with solumedrol and antibiotics. Discharged on zpack and prednisone. Clinically improved. Feeling significantly better. Continue symbicort two puffs morning and evening, Spiriva two puffs daily and albuterol 2 puffs every 6 hours as needed for shrotness of breath and wheezing.

## 2022-04-17 NOTE — Assessment & Plan Note (Signed)
-   Patient falls asleep easily during the day and snores loudly. Strong suspicion patient may have sleep apnea, needs split night sleep study in-lab

## 2022-04-30 ENCOUNTER — Ambulatory Visit: Payer: Medicaid Other | Admitting: Pulmonary Disease

## 2022-07-02 ENCOUNTER — Ambulatory Visit: Payer: Medicaid Other | Admitting: Pulmonary Disease

## 2024-01-05 ENCOUNTER — Other Ambulatory Visit: Payer: Self-pay | Admitting: Physician Assistant

## 2024-01-05 DIAGNOSIS — Z720 Tobacco use: Secondary | ICD-10-CM

## 2024-01-05 DIAGNOSIS — Z122 Encounter for screening for malignant neoplasm of respiratory organs: Secondary | ICD-10-CM

## 2024-01-05 DIAGNOSIS — Z1231 Encounter for screening mammogram for malignant neoplasm of breast: Secondary | ICD-10-CM

## 2024-01-26 IMAGING — DX DG CHEST 1V PORT
1 series · 1 of 1 positions shown · non-contrast
Comparison: Earlier radiograph dated 01/13/2022.

CLINICAL DATA: Status post intubation.

EXAM:
PORTABLE CHEST 1 VIEW

[chest ap]
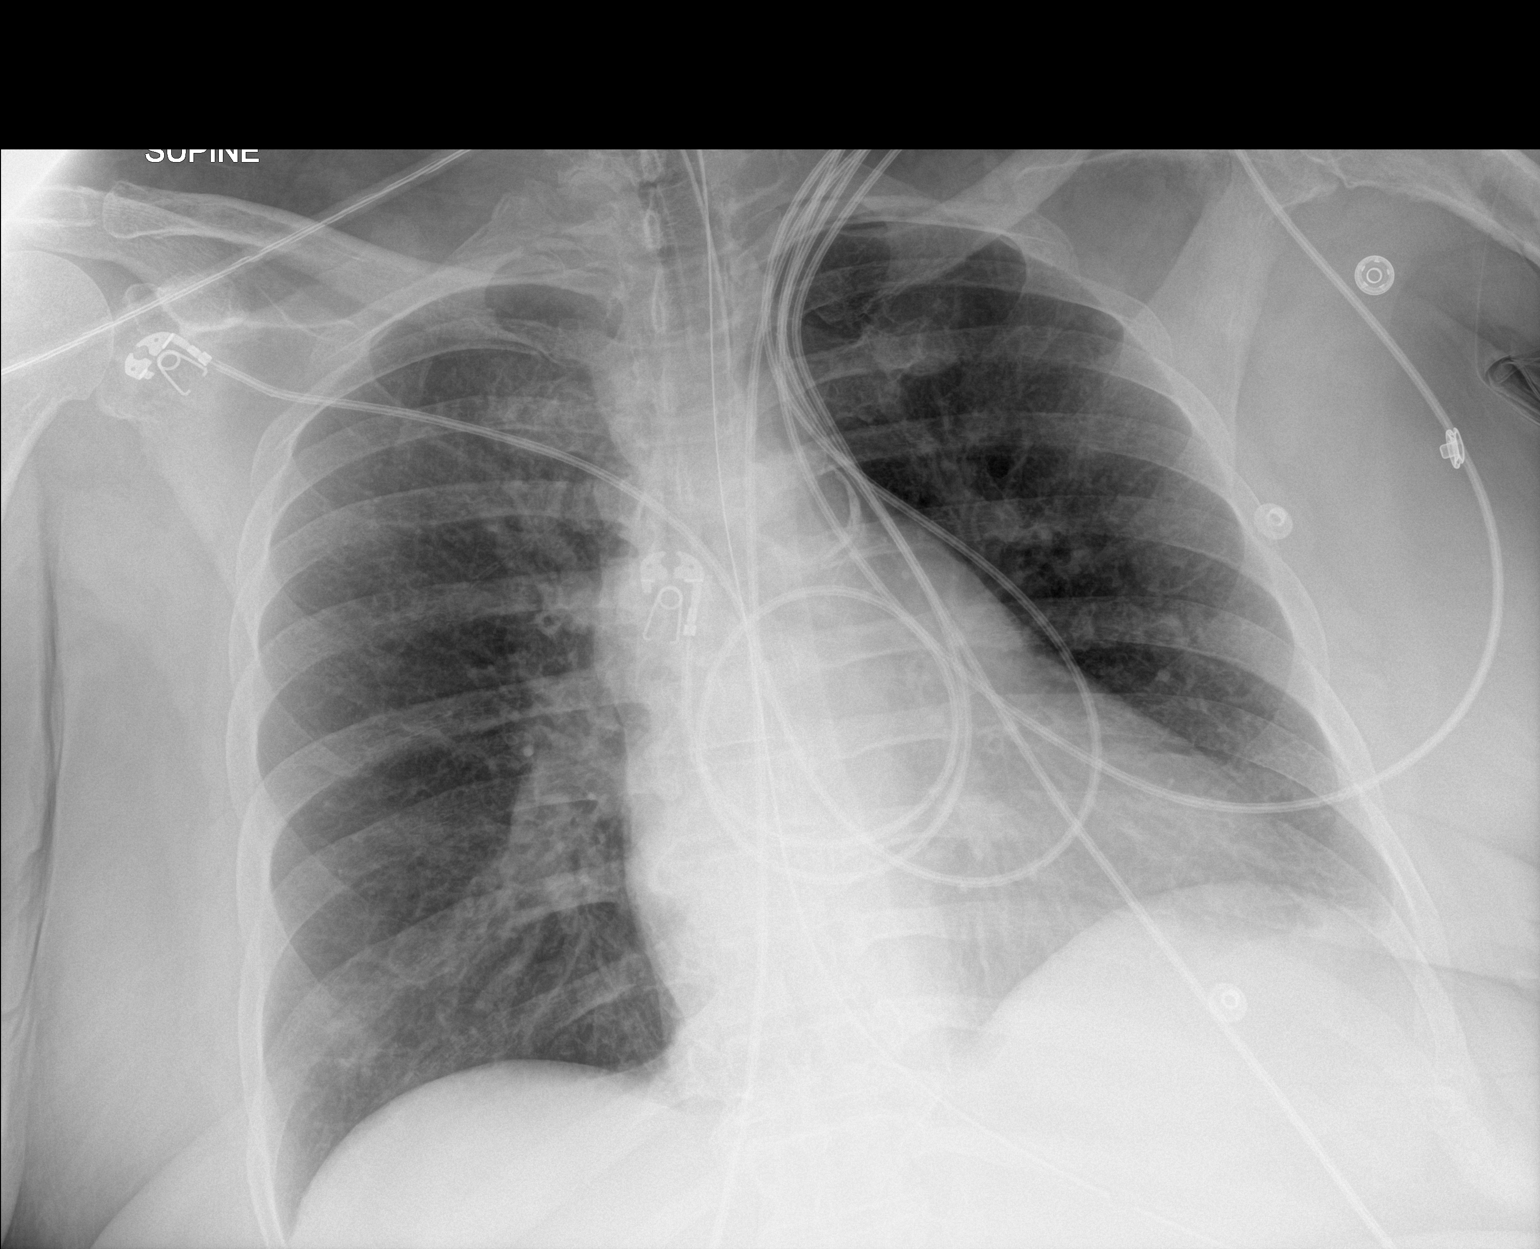

[1 of 1 positions shown; findings below may reference images not displayed]

FINDINGS: Interval placement of endotracheal tube with tip approximately
cm above the carina. Enteric tube extends below the diaphragm with
side-port in the proximal stomach and tip beyond the inferior margin
of the image.

The lungs are clear. There is no pleural effusion pneumothorax. The
cardiac silhouette is within limits. Atherosclerotic calcification
of the aortic arch. No acute osseous pathology.
IMPRESSION: 1. Endotracheal tube above the carina.
2. No acute cardiopulmonary process.
